# Patient Record
Sex: Male | Born: 1935 | Race: Black or African American | Hispanic: No | Marital: Married | Smoking: Former smoker
Health system: Southern US, Community
[De-identification: ages and names within clinical notes are randomized; demographics above are authoritative.]

## PROBLEM LIST (undated history)

## (undated) DIAGNOSIS — E119 Type 2 diabetes mellitus without complications: Secondary | ICD-10-CM

## (undated) DIAGNOSIS — C801 Malignant (primary) neoplasm, unspecified: Secondary | ICD-10-CM

## (undated) DIAGNOSIS — G629 Polyneuropathy, unspecified: Secondary | ICD-10-CM

## (undated) DIAGNOSIS — I10 Essential (primary) hypertension: Secondary | ICD-10-CM

## (undated) HISTORY — PX: OTHER SURGICAL HISTORY: SHX169

---

## 1999-04-10 ENCOUNTER — Ambulatory Visit (HOSPITAL_COMMUNITY): Admission: RE | Admit: 1999-04-10 | Discharge: 1999-04-10 | Payer: Self-pay | Admitting: Family Medicine

## 2000-01-12 ENCOUNTER — Encounter: Payer: Self-pay | Admitting: Family Medicine

## 2000-01-12 ENCOUNTER — Encounter: Admission: RE | Admit: 2000-01-12 | Discharge: 2000-01-12 | Payer: Self-pay | Admitting: Family Medicine

## 2000-10-14 ENCOUNTER — Ambulatory Visit: Admission: RE | Admit: 2000-10-14 | Discharge: 2000-10-14 | Payer: Self-pay | Admitting: Orthopaedic Surgery

## 2000-10-14 ENCOUNTER — Encounter: Admission: RE | Admit: 2000-10-14 | Discharge: 2000-10-14 | Payer: Self-pay | Admitting: Orthopaedic Surgery

## 2000-10-14 ENCOUNTER — Encounter: Payer: Self-pay | Admitting: Orthopaedic Surgery

## 2000-10-28 ENCOUNTER — Ambulatory Visit: Admission: RE | Admit: 2000-10-28 | Discharge: 2000-10-28 | Payer: Self-pay | Admitting: Pulmonary Disease

## 2000-12-07 ENCOUNTER — Other Ambulatory Visit: Admission: RE | Admit: 2000-12-07 | Discharge: 2000-12-07 | Payer: Self-pay | Admitting: Urology

## 2000-12-20 ENCOUNTER — Ambulatory Visit: Admission: RE | Admit: 2000-12-20 | Discharge: 2001-03-20 | Payer: Self-pay | Admitting: Radiation Oncology

## 2000-12-23 ENCOUNTER — Encounter: Payer: Self-pay | Admitting: Urology

## 2000-12-23 ENCOUNTER — Encounter: Admission: RE | Admit: 2000-12-23 | Discharge: 2000-12-23 | Payer: Self-pay | Admitting: Urology

## 2001-01-17 ENCOUNTER — Ambulatory Visit (HOSPITAL_COMMUNITY): Admission: RE | Admit: 2001-01-17 | Discharge: 2001-01-17 | Payer: Self-pay | Admitting: Urology

## 2001-01-17 ENCOUNTER — Encounter: Payer: Self-pay | Admitting: Urology

## 2001-01-27 ENCOUNTER — Encounter: Payer: Self-pay | Admitting: Pulmonary Disease

## 2001-01-27 ENCOUNTER — Encounter: Admission: RE | Admit: 2001-01-27 | Discharge: 2001-01-27 | Payer: Self-pay | Admitting: Pulmonary Disease

## 2001-03-06 ENCOUNTER — Ambulatory Visit (HOSPITAL_COMMUNITY): Admission: RE | Admit: 2001-03-06 | Discharge: 2001-03-06 | Payer: Self-pay | Admitting: Urology

## 2001-03-06 ENCOUNTER — Encounter: Payer: Self-pay | Admitting: Urology

## 2001-03-27 ENCOUNTER — Ambulatory Visit: Admission: RE | Admit: 2001-03-27 | Discharge: 2001-06-25 | Payer: Self-pay | Admitting: Radiation Oncology

## 2001-08-10 ENCOUNTER — Ambulatory Visit (HOSPITAL_COMMUNITY): Admission: RE | Admit: 2001-08-10 | Discharge: 2001-08-10 | Payer: Self-pay | Admitting: Family Medicine

## 2005-07-28 ENCOUNTER — Encounter: Admission: RE | Admit: 2005-07-28 | Discharge: 2005-07-28 | Payer: Self-pay | Admitting: General Surgery

## 2005-08-02 ENCOUNTER — Ambulatory Visit (HOSPITAL_BASED_OUTPATIENT_CLINIC_OR_DEPARTMENT_OTHER): Admission: RE | Admit: 2005-08-02 | Discharge: 2005-08-02 | Payer: Self-pay | Admitting: General Surgery

## 2005-12-28 ENCOUNTER — Ambulatory Visit: Payer: Self-pay | Admitting: Gastroenterology

## 2006-02-02 ENCOUNTER — Ambulatory Visit: Payer: Self-pay | Admitting: Gastroenterology

## 2006-05-25 ENCOUNTER — Ambulatory Visit: Payer: Self-pay

## 2006-06-01 ENCOUNTER — Ambulatory Visit: Payer: Self-pay | Admitting: Cardiology

## 2006-06-14 ENCOUNTER — Ambulatory Visit: Payer: Self-pay

## 2012-07-11 ENCOUNTER — Other Ambulatory Visit: Payer: Self-pay | Admitting: Internal Medicine

## 2012-07-11 DIAGNOSIS — I739 Peripheral vascular disease, unspecified: Secondary | ICD-10-CM

## 2012-07-13 ENCOUNTER — Ambulatory Visit
Admission: RE | Admit: 2012-07-13 | Discharge: 2012-07-13 | Disposition: A | Discharge status: Home / Self Care | Payer: Medicare Other | Source: Ambulatory Visit | Attending: Internal Medicine | Admitting: Internal Medicine

## 2012-07-13 DIAGNOSIS — I739 Peripheral vascular disease, unspecified: Secondary | ICD-10-CM

## 2014-01-31 ENCOUNTER — Other Ambulatory Visit: Payer: Self-pay | Admitting: Internal Medicine

## 2014-01-31 ENCOUNTER — Ambulatory Visit
Admission: RE | Admit: 2014-01-31 | Discharge: 2014-01-31 | Disposition: A | Discharge status: Home / Self Care | Payer: Medicare Other | Source: Ambulatory Visit | Attending: Internal Medicine | Admitting: Internal Medicine

## 2014-01-31 DIAGNOSIS — M549 Dorsalgia, unspecified: Secondary | ICD-10-CM

## 2014-04-01 ENCOUNTER — Encounter (HOSPITAL_COMMUNITY): Payer: Medicare Other

## 2014-04-09 ENCOUNTER — Encounter (HOSPITAL_COMMUNITY): Payer: Self-pay

## 2015-07-21 ENCOUNTER — Observation Stay (HOSPITAL_COMMUNITY)
Admission: EM | Admit: 2015-07-21 | Discharge: 2015-07-23 | Disposition: A | Discharge status: Skilled Nursing Facility | Payer: Medicare Other | Attending: Internal Medicine | Admitting: Internal Medicine

## 2015-07-21 ENCOUNTER — Emergency Department (HOSPITAL_COMMUNITY): Payer: Medicare Other

## 2015-07-21 ENCOUNTER — Encounter (HOSPITAL_COMMUNITY): Payer: Self-pay | Admitting: *Deleted

## 2015-07-21 DIAGNOSIS — Z7982 Long term (current) use of aspirin: Secondary | ICD-10-CM | POA: Insufficient documentation

## 2015-07-21 DIAGNOSIS — Z8546 Personal history of malignant neoplasm of prostate: Secondary | ICD-10-CM | POA: Diagnosis not present

## 2015-07-21 DIAGNOSIS — Z79891 Long term (current) use of opiate analgesic: Secondary | ICD-10-CM | POA: Insufficient documentation

## 2015-07-21 DIAGNOSIS — Z87891 Personal history of nicotine dependence: Secondary | ICD-10-CM | POA: Insufficient documentation

## 2015-07-21 DIAGNOSIS — T148 Other injury of unspecified body region: Secondary | ICD-10-CM | POA: Diagnosis not present

## 2015-07-21 DIAGNOSIS — J111 Influenza due to unidentified influenza virus with other respiratory manifestations: Principal | ICD-10-CM

## 2015-07-21 DIAGNOSIS — A419 Sepsis, unspecified organism: Secondary | ICD-10-CM | POA: Diagnosis not present

## 2015-07-21 DIAGNOSIS — N179 Acute kidney failure, unspecified: Secondary | ICD-10-CM | POA: Diagnosis not present

## 2015-07-21 DIAGNOSIS — R509 Fever, unspecified: Secondary | ICD-10-CM | POA: Diagnosis present

## 2015-07-21 DIAGNOSIS — E1140 Type 2 diabetes mellitus with diabetic neuropathy, unspecified: Secondary | ICD-10-CM | POA: Diagnosis not present

## 2015-07-21 DIAGNOSIS — E785 Hyperlipidemia, unspecified: Secondary | ICD-10-CM | POA: Insufficient documentation

## 2015-07-21 DIAGNOSIS — I10 Essential (primary) hypertension: Secondary | ICD-10-CM

## 2015-07-21 DIAGNOSIS — E11649 Type 2 diabetes mellitus with hypoglycemia without coma: Secondary | ICD-10-CM | POA: Insufficient documentation

## 2015-07-21 DIAGNOSIS — R109 Unspecified abdominal pain: Secondary | ICD-10-CM

## 2015-07-21 DIAGNOSIS — E876 Hypokalemia: Secondary | ICD-10-CM | POA: Diagnosis not present

## 2015-07-21 DIAGNOSIS — E119 Type 2 diabetes mellitus without complications: Secondary | ICD-10-CM | POA: Diagnosis not present

## 2015-07-21 DIAGNOSIS — Z7984 Long term (current) use of oral hypoglycemic drugs: Secondary | ICD-10-CM | POA: Diagnosis not present

## 2015-07-21 DIAGNOSIS — Z79899 Other long term (current) drug therapy: Secondary | ICD-10-CM | POA: Diagnosis not present

## 2015-07-21 DIAGNOSIS — S098XXA Other specified injuries of head, initial encounter: Secondary | ICD-10-CM | POA: Diagnosis not present

## 2015-07-21 DIAGNOSIS — R531 Weakness: Secondary | ICD-10-CM | POA: Diagnosis not present

## 2015-07-21 HISTORY — DX: Polyneuropathy, unspecified: G62.9

## 2015-07-21 HISTORY — DX: Essential (primary) hypertension: I10

## 2015-07-21 HISTORY — DX: Type 2 diabetes mellitus without complications: E11.9

## 2015-07-21 HISTORY — DX: Malignant (primary) neoplasm, unspecified: C80.1

## 2015-07-21 LAB — COMPREHENSIVE METABOLIC PANEL
ALBUMIN: 3.9 g/dL (ref 3.5–5.0)
ALK PHOS: 35 U/L — AB (ref 38–126)
ALT: 32 U/L (ref 17–63)
ANION GAP: 12 (ref 5–15)
AST: 82 U/L — AB (ref 15–41)
BUN: 18 mg/dL (ref 6–20)
CALCIUM: 8.6 mg/dL — AB (ref 8.9–10.3)
CO2: 23 mmol/L (ref 22–32)
Chloride: 102 mmol/L (ref 101–111)
Creatinine, Ser: 1.58 mg/dL — ABNORMAL HIGH (ref 0.61–1.24)
GFR calc Af Amer: 42 mL/min — ABNORMAL LOW (ref >60)
GFR calc non Af Amer: 37 mL/min — ABNORMAL LOW (ref >60)
GLUCOSE: 89 mg/dL (ref 65–99)
POTASSIUM: 3.1 mmol/L — AB (ref 3.5–5.1)
SODIUM: 137 mmol/L (ref 135–145)
Total Bilirubin: 0.9 mg/dL (ref 0.3–1.2)
Total Protein: 7.5 g/dL (ref 6.5–8.1)

## 2015-07-21 LAB — INFLUENZA PANEL BY PCR (TYPE A & B)
H1N1 flu by pcr: NOT DETECTED
Influenza A By PCR: POSITIVE — AB
Influenza B By PCR: NEGATIVE

## 2015-07-21 LAB — URINALYSIS, ROUTINE W REFLEX MICROSCOPIC
BILIRUBIN URINE: NEGATIVE
GLUCOSE, UA: NEGATIVE mg/dL
Ketones, ur: NEGATIVE mg/dL
Leukocytes, UA: NEGATIVE
Nitrite: NEGATIVE
PH: 5.5 (ref 5.0–8.0)
Protein, ur: >300 mg/dL — AB
SPECIFIC GRAVITY, URINE: 1.021 (ref 1.005–1.030)

## 2015-07-21 LAB — CBC WITH DIFFERENTIAL/PLATELET
BASOS PCT: 0 %
Basophils Absolute: 0 10*3/uL (ref 0.0–0.1)
Eosinophils Absolute: 0 10*3/uL (ref 0.0–0.7)
Eosinophils Relative: 0 %
HEMATOCRIT: 40.6 % (ref 39.0–52.0)
HEMOGLOBIN: 13.4 g/dL (ref 13.0–17.0)
LYMPHS PCT: 12 %
Lymphs Abs: 0.7 10*3/uL (ref 0.7–4.0)
MCH: 28.5 pg (ref 26.0–34.0)
MCHC: 33 g/dL (ref 30.0–36.0)
MCV: 86.2 fL (ref 78.0–100.0)
MONOS PCT: 7 %
Monocytes Absolute: 0.4 10*3/uL (ref 0.1–1.0)
NEUTROS ABS: 4.4 10*3/uL (ref 1.7–7.7)
Neutrophils Relative %: 81 %
Platelets: UNDETERMINED 10*3/uL (ref 150–400)
RBC: 4.71 MIL/uL (ref 4.22–5.81)
RDW: 14.9 % (ref 11.5–15.5)
WBC: 5.5 10*3/uL (ref 4.0–10.5)

## 2015-07-21 LAB — GLUCOSE, CAPILLARY: Glucose-Capillary: 93 mg/dL (ref 65–99)

## 2015-07-21 LAB — URINE MICROSCOPIC-ADD ON

## 2015-07-21 LAB — APTT: APTT: 35 s (ref 24–37)

## 2015-07-21 LAB — LACTIC ACID, PLASMA
Lactic Acid, Venous: 1.5 mmol/L (ref 0.5–2.0)
Lactic Acid, Venous: 1.4 mmol/L (ref 0.5–2.0)

## 2015-07-21 LAB — PSA: PSA: <0.01 ng/mL (ref 0.00–4.00)

## 2015-07-21 LAB — I-STAT CG4 LACTIC ACID, ED: Lactic Acid, Venous: 1.8 mmol/L (ref 0.5–2.0)

## 2015-07-21 LAB — PROTIME-INR
INR: 1.06 (ref 0.00–1.49)
Prothrombin Time: 14 seconds (ref 11.6–15.2)

## 2015-07-21 LAB — PROCALCITONIN: PROCALCITONIN: 1.27 ng/mL

## 2015-07-21 LAB — CBC AND DIFFERENTIAL: WBC: 5.5 10^3/mL

## 2015-07-21 MED ORDER — OXYBUTYNIN CHLORIDE 5 MG PO TABS
5.0000 mg | ORAL_TABLET | Freq: Every day | ORAL | Status: DC
  Administered 2015-07-21 – 2015-07-23 (×3): 5 mg via ORAL
  Filled 2015-07-21 (×3): qty 1

## 2015-07-21 MED ORDER — ATORVASTATIN CALCIUM 10 MG PO TABS
10.0000 mg | ORAL_TABLET | Freq: Every day | ORAL | Status: DC
  Administered 2015-07-22 – 2015-07-23 (×2): 10 mg via ORAL
  Filled 2015-07-21 (×3): qty 1

## 2015-07-21 MED ORDER — OSELTAMIVIR PHOSPHATE 30 MG PO CAPS
30.0000 mg | ORAL_CAPSULE | Freq: Two times a day (BID) | ORAL | Status: DC
  Administered 2015-07-21 – 2015-07-23 (×4): 30 mg via ORAL
  Filled 2015-07-21 (×5): qty 1

## 2015-07-21 MED ORDER — GABAPENTIN 300 MG PO CAPS
300.0000 mg | ORAL_CAPSULE | Freq: Three times a day (TID) | ORAL | Status: DC
  Administered 2015-07-22 – 2015-07-23 (×5): 300 mg via ORAL
  Filled 2015-07-21 (×7): qty 1

## 2015-07-21 MED ORDER — ONDANSETRON HCL 4 MG/2ML IJ SOLN: 4.0000 mg | Freq: Four times a day (QID) | INTRAMUSCULAR | Status: DC | PRN

## 2015-07-21 MED ORDER — ALBUTEROL SULFATE (2.5 MG/3ML) 0.083% IN NEBU: 2.5000 mg | INHALATION_SOLUTION | RESPIRATORY_TRACT | Status: DC | PRN

## 2015-07-21 MED ORDER — VANCOMYCIN HCL IN DEXTROSE 1-5 GM/200ML-% IV SOLN
1000.0000 mg | Freq: Once | INTRAVENOUS | Status: AC
  Administered 2015-07-21: 1000 mg via INTRAVENOUS
  Filled 2015-07-21: qty 200

## 2015-07-21 MED ORDER — SODIUM CHLORIDE 0.9 % IV BOLUS (SEPSIS)
500.0000 mL | INTRAVENOUS | Status: AC
  Administered 2015-07-21: 500 mL via INTRAVENOUS

## 2015-07-21 MED ORDER — TAMSULOSIN HCL 0.4 MG PO CAPS
0.4000 mg | ORAL_CAPSULE | Freq: Every day | ORAL | Status: DC
  Administered 2015-07-21 – 2015-07-23 (×3): 0.4 mg via ORAL
  Filled 2015-07-21 (×3): qty 1

## 2015-07-21 MED ORDER — SODIUM CHLORIDE 0.9 % IV SOLN
INTRAVENOUS | Status: DC
  Administered 2015-07-21 – 2015-07-22 (×2): via INTRAVENOUS

## 2015-07-21 MED ORDER — VANCOMYCIN HCL IN DEXTROSE 1-5 GM/200ML-% IV SOLN
1000.0000 mg | INTRAVENOUS | Status: DC
  Administered 2015-07-22: 1000 mg via INTRAVENOUS
  Filled 2015-07-21: qty 200

## 2015-07-21 MED ORDER — HYDROCODONE-ACETAMINOPHEN 5-325 MG PO TABS
1.0000 | ORAL_TABLET | Freq: Three times a day (TID) | ORAL | Status: DC | PRN
  Administered 2015-07-21 – 2015-07-22 (×2): 1 via ORAL
  Filled 2015-07-21 (×2): qty 1

## 2015-07-21 MED ORDER — SODIUM CHLORIDE 0.9 % IV BOLUS (SEPSIS)
1000.0000 mL | INTRAVENOUS | Status: AC
  Administered 2015-07-21 (×2): 1000 mL via INTRAVENOUS

## 2015-07-21 MED ORDER — POTASSIUM CHLORIDE CRYS ER 20 MEQ PO TBCR
40.0000 meq | EXTENDED_RELEASE_TABLET | Freq: Once | ORAL | Status: AC
  Administered 2015-07-21: 40 meq via ORAL
  Filled 2015-07-21: qty 2

## 2015-07-21 MED ORDER — ROPINIROLE HCL 0.25 MG PO TABS
0.2500 mg | ORAL_TABLET | Freq: Every day | ORAL | Status: DC
  Administered 2015-07-21 – 2015-07-22 (×2): 0.25 mg via ORAL
  Filled 2015-07-21 (×3): qty 1

## 2015-07-21 MED ORDER — CLONIDINE HCL 0.2 MG PO TABS
0.2000 mg | ORAL_TABLET | Freq: Two times a day (BID) | ORAL | Status: DC
  Administered 2015-07-21 – 2015-07-23 (×4): 0.2 mg via ORAL
  Filled 2015-07-21 (×5): qty 1

## 2015-07-21 MED ORDER — ACETAMINOPHEN 650 MG RE SUPP
650.0000 mg | Freq: Once | RECTAL | Status: AC
  Administered 2015-07-21: 650 mg via RECTAL
  Filled 2015-07-21: qty 1

## 2015-07-21 MED ORDER — ACETAMINOPHEN 325 MG PO TABS: 650.0000 mg | ORAL_TABLET | Freq: Four times a day (QID) | ORAL | Status: DC | PRN

## 2015-07-21 MED ORDER — INSULIN ASPART 100 UNIT/ML ~~LOC~~ SOLN
0.0000 [IU] | Freq: Three times a day (TID) | SUBCUTANEOUS | Status: DC
  Administered 2015-07-22 – 2015-07-23 (×2): 2 [IU] via SUBCUTANEOUS
  Administered 2015-07-23: 1 [IU] via SUBCUTANEOUS

## 2015-07-21 MED ORDER — ASPIRIN EC 81 MG PO TBEC
81.0000 mg | DELAYED_RELEASE_TABLET | Freq: Every day | ORAL | Status: DC
  Administered 2015-07-21 – 2015-07-23 (×3): 81 mg via ORAL
  Filled 2015-07-21 (×3): qty 1

## 2015-07-21 MED ORDER — ONDANSETRON HCL 4 MG PO TABS: 4.0000 mg | ORAL_TABLET | Freq: Four times a day (QID) | ORAL | Status: DC | PRN

## 2015-07-21 MED ORDER — PIPERACILLIN-TAZOBACTAM 3.375 G IVPB
3.3750 g | Freq: Three times a day (TID) | INTRAVENOUS | Status: DC
Start: 2015-07-21 — End: 2015-07-22
  Administered 2015-07-21 – 2015-07-22 (×3): 3.375 g via INTRAVENOUS
  Filled 2015-07-21 (×4): qty 50

## 2015-07-21 MED ORDER — SODIUM CHLORIDE 0.9 % IV SOLN
INTRAVENOUS | Status: AC
Start: 2015-07-21 — End: 2015-07-22
  Administered 2015-07-21: 19:00:00 via INTRAVENOUS

## 2015-07-21 MED ORDER — PIPERACILLIN-TAZOBACTAM 3.375 G IVPB 30 MIN
3.3750 g | Freq: Once | INTRAVENOUS | Status: AC
  Administered 2015-07-21: 3.375 g via INTRAVENOUS
  Filled 2015-07-21: qty 50

## 2015-07-21 MED ORDER — HEPARIN SODIUM (PORCINE) 5000 UNIT/ML IJ SOLN
5000.0000 [IU] | Freq: Three times a day (TID) | INTRAMUSCULAR | Status: DC
  Administered 2015-07-21 – 2015-07-23 (×6): 5000 [IU] via SUBCUTANEOUS
  Filled 2015-07-21 (×8): qty 1

## 2015-07-21 NOTE — H&P (Signed)
Patient Demographics  Mark Valenzuela, is a 82 y.o. male  MRN: [id number]   DOB - 1923/03/31  Admit Date - 07/21/2015  Outpatient Primary MD for the patient is [name], MD   With History of -  Past Medical History  Diagnosis Date  . Diabetes mellitus without complication ([place])   . Hypertension   . Cancer (HCC) approx. 2002    prostate cancer  . Neuropathy [facility])       Past Surgical History  Procedure Laterality Date  . Seed implants  approx 2002    for prostate cancer    in for   Chief Complaint  Patient presents with  . Code Sepsis     HPI  Mark Valenzuela  is a 82 y.o. male, past medical history of diabetes mellitus, hypertension, prostate cancer, diabetic neuropathy, is at home with his wife, presents with complaints of generalized weakness, fatigue, poor appetite, fever and chills, and diarrhea, reports his symptoms started Saturday, he had his sister visiting who was feeling ill, will family reports episodes of hypoglycemia as he was taking his diabetes medication but with poor oral intake, NED significant for fever 104.1, but no leukocytosis, chest x-ray with no acute cardiopulmonary process, urinalysis, patient has blood culture sent, started on IV vancomycin and Zosyn for sepsis, hospitalist called to admit.    Review of Systems    In addition to the HPI above,  reports Fever-chills, No Headache, No changes with Vision or hearing, No problems swallowing food or Liquids, No Chest pain,  planes of Cou[place] denies Shortness of Breath, No Abdominal pain, No Nausea or Vomports diarrhea over last 2 days No Blood in stool or Urine, No dysuria, No new skin rashes or bruises,  report generalized body ache report generalized weakness and fatigue, but no focal deficits No recent weight gain or loss, No polyuria, polydypsia or polyphagia, No significant Mental Stressors.  A full 10 point Review of Systems was done, except as stated above, all other Review of  Systems were negative.   Social History Social History  Substance Use Topics  . Smoking status: Former Smoker -- 0.50 packs/day for 50 years    Types: Cigarettes    Quit date: 06/06/2015  . Smokeless tobacco: Never Used  . Alcohol Use: No     Comment: 07/21/15 -quit approx 31 years today     Family History  family history significant for hypertension   Prior to Admission medications   Medication Sig Start Date End Date Taking? Authorizing Provider  acetaminophen (TYLENOL) 500 MG tablet Take 500 mg by mouth every 6 (six) hours as needed for mild pain.   Yes Historical Provider, MD  aspirin EC 81 MG tablet Take 81 mg by mouth daily.   Yes Historical Provider, MD  atorvastatin (LIPITOR) 10 MG tablet Take 10 mg by mouth daily.   Yes Historical Provider, MD  cloNIDine (CATAPRES) 0.2 MG tablet Take 0.2 mg by mouth 2 (two) times daily.   Yes Historical Provider, MD  gabapentin (NEURONTIN) 300 MG capsule Take 300 mg by mouth 3 (three) times daily.   Yes Historical Provider, MD  glimepiride (AMARYL) 4 MG tablet Take 1 tablet by mouth daily. 07/10/15   Historical Provider, MD  glyBURIDE (DIABETA) 5 MG tablet Take 5 mg by mouth daily with breakfast.   Yes Historical Provider, MD  HYDROcodone-acetaminophen (NORCO/VICODIN) 5-325 MG tablet Take 1 tablet by mouth every 8 (eight) hours as needed for moderate pain.   Yes Historical  Provider, MD  oxybutynin (DITROPAN) 5 MG tablet Take 5 mg by mouth daily.   Yes Historical Provider, MD  rOPINIRole (REQUIP) 0.25 MG tablet Take 0.25 mg by mouth at bedtime.   Yes Historical Provider, MD  tamsulosin (FLOMAX) 0.4 MG CAPS capsule Take 0.4 mg by mouth daily.   Yes Historical Provider, MD    No Known Allergies  Physical Exam  Vitals  Blood pressure 135/76, pulse 81, temperature 104.1 F (40.1 C), temperature source Rectal, resp. rate 34, height 5\' 8"  (1.727 m), weight 77.111 kg (170 lb), SpO2 96 %.   1. General frail elderly male lying in bed in NAD,      2. Normal affect and insight, Not Suicidal or Homicidal, Awake Alert, Oriented X 2.  3. No F.N deficits, ALL C.Nerves Intact, Strength 5/5 all 4 extremities, Sensation intact all 4 extremities, Plantars down going.  4. Ears and Eyes appear Normal, Conjunctivae clear, PERRLA. dry Oral Mucosa.  5. Supple Neck, No JVD, No cervical lymphadenopathy appriciated, No Carotid Bruits.  6. Symmetrical Chest wall movement, Good air movement bilaterally, CTAB.  7. RRR, No Gallops, Rubs or Murmurs, No Parasternal Heave.  8. Positive Bowel Sounds, Abdomen Soft, No tenderness, No organomegaly appriciated,No rebound -guarding or rigidity.  9.  No Cyanosis, Normal Skin Turgor, No Skin Rash or Bruise.  10. Good muscle tone,  joints appear normal , no effusions, Normal ROM.    Data Review  CBC  Recent Labs Lab 07/21/15 1431  WBC 5.5  HGB 13.4  HCT 40.6  PLT PLATELET CLUMPS NOTED ON SMEAR, UNABLE TO ESTIMATE  MCV 86.2  MCH 28.5  MCHC 33.0  RDW 14.9  LYMPHSABS 0.7  MONOABS 0.4  EOSABS 0.0  BASOSABS 0.0   ------------------------------------------------------------------------------------------------------------------  Chemistries   Recent Labs Lab 07/21/15 1431  NA 137  K 3.1*  CL 102  CO2 23  GLUCOSE 89  BUN 18  CREATININE 1.58*  CALCIUM 8.6*  AST 82*  ALT 32  ALKPHOS 35*  BILITOT 0.9   ------------------------------------------------------------------------------------------------------------------ estimated creatinine clearance is 29.5 mL/min (by C-G formula based on Cr of 1.58). ------------------------------------------------------------------------------------------------------------------ No results for input(s): TSH, T4TOTAL, T3FREE, THYROIDAB in the last 72 hours.  Invalid input(s): FREET3   Coagulation profile No results for input(s): INR, PROTIME in the last 168  hours. ------------------------------------------------------------------------------------------------------------------- No results for input(s): DDIMER in the last 72 hours. -------------------------------------------------------------------------------------------------------------------  Cardiac Enzymes No results for input(s): CKMB, TROPONINI, MYOGLOBIN in the last 168 hours.  Invalid input(s): CK ------------------------------------------------------------------------------------------------------------------ Invalid input(s): POCBNP   ---------------------------------------------------------------------------------------------------------------  Urinalysis    Component Value Date/Time   COLORURINE YELLOW 07/21/2015 1514   APPEARANCEUR CLOUDY* 07/21/2015 1514   LABSPEC 1.021 07/21/2015 1514   PHURINE 5.5 07/21/2015 1514   GLUCOSEU NEGATIVE 07/21/2015 1514   HGBUR LARGE* 07/21/2015 [place] 07/21/2015 1514   KETONESUR NEGATIVE 07/21/2015 1514   PROTEINUR >300* 07/21/2015 1514   NITRITE NEGATIVE 07/21/2015 1514   LEUKOCYTESUR NEGATIVE 07/21/2015 1514    ----------------------------------------------------------------------------------------------------------------  Imaging results:   Dg Chest 2 View  07/21/2015  CLINICAL DATA:  Generalized weakness with diarrhea and shortness of breath. EXAM: CHEST  2 VIEW COMPARISON:  Chest radiographs 01/17/2001 and 07/28/2005. Lumbar spine radiographs 01/31/2014. FINDINGS: There are lower lung volumes. Allowing for this, the heart size and mediastinal contours are stable with mild vascular tortuosity. There is diffuse central airway thickening without focal airspace disease or edema. The previously demonstrated right lung nodule is not well visualized. There is no pleural effusion  or pneumothorax. Mild generalized osteosclerosis is suggested (not definite). IMPRESSION: 1. Low lung volumes with generalized central airway  thickening. No focal airspace disease or edema. 2. Suggested mild generalized osteosclerosis. In this patient with apparent prostate cancer, this could reflect metastatic disease. Correlation with serum PSA recommended. Electronically Signed   By: Richardean Sale M.D.   On: 07/21/2015 15:47       Assessment & Plan  Active Problems:   Sepsis ([place])   Fever   Hypertension   Diabetes mellitus type 2 in nonobese (HCC)   Acute renal failure (ARF) (HCC)  Fever -   patient presents with fever, not under sepsis order set, and cultures were sent, has negative urinalysis, chest x-ray with no evidence of infection, no nuchal rigidity and no evidence of meningeal sign, she reports sick contacts, and complains of diarrhea, symptoms most likely related to viral illness. - continue with IV fluids - Follow on septic workup - start Empirically on Tamiflu pending influenza panel and especially giving his significant high fever -  will follow on GI panel given his diarrhea   Acute renal failure -Likely related to volume depletion from poor oral intake, will start on IV fluids  Hypertension  -Blood pressure acceptable, continue with home medication    diabetes mellitus  - We'll hold oral hypoglycemic agent as by mouth is unpredictable, will start on insulin sliding scale, will keep in dysphagia 2 diet giving poor dentition .  Diabetic neuropathy  - Opinion with his Neurontin   Hyperlipidemia  -Continue with statin   DVT Prophylaxis Heparin   AM Labs Ordered, also please review Full Orders  Family Communication: Admission, patients condition and plan of care including tests being ordered have been discussed with the patient and wife and daughter who indicate understanding and agree with the plan and Code Status.  Code Status Full  Likely DC to  Pending PT evaluation  Condition GUARDED    Time spent in minutes : 65 miutes    Jovi Alvizo M.D on 07/21/2015 at 5:46 PM  Between 7am to  7pm - Pager - [phone]  After 7pm go to [email or web address] - password [email or web address]  And look for the night coverage person covering me after hours  [facility] Office  [phone]

## 2015-07-21 NOTE — Progress Notes (Signed)
Pharmacy Antibiotic Note  [name] is a 82 y.o. male admitted on 07/21/2015 with sepsis and fall with injury to back right occiptal area with small hematoma.  Noted fever 104.1.  Pharmacy has been consulted for Vancomycin and Zosyn dosing.  First doses ordered in ED. No allergies noted.    -CrCl ~30 ml/min  Plan: Vancomycin 1g IV every 24 hours.  Goal trough 15-20 mcg/mL.  Zosyn 3.375g IV q8h (infuse over 4 hours) F/u renal fxn closely to adjust F/u cultures   Height: 5\' 8"  (172.7 cm) Weight: 170 lb (77.111 kg) IBW/kg (Calculated) : 68.4  Temp (24hrs), Avg:102.5 F (39.2 C), Min:100.9 F (38.3 C), Max:104.1 F (40.1 C)   Recent Labs Lab 07/21/15 1440  LATICACIDVEN 1.80    CrCl cannot be calculated (Patient has no serum creatinine result on file.).    No Known Allergies  Antimicrobials this admission: 3/6 Vancomycin >>  3/6 Zosyn >>   Dose adjustments this admission: N/A  Microbiology results: 3/6 BCx: IP 3/6 UCx: IP (U/A: Many bacteria) 3/6 GI PCR Panel: Ordered  Thank you for allowing pharmacy to be a part of this patient's care.  Ralene Bathe, PharmD, BCPS 07/21/2015, 3:58 PM  Pager: [phone]

## 2015-07-21 NOTE — ED Notes (Addendum)
Per EMS pt from home been sick for 3 days. Family reports diarrhea and generalized weakness. Pt is having trouble standing today. Today at home with EMS pt had 103.3 temp with home thermometer. 1000 mg Tylenol PO given in route. BP 150/90, HR 96, 95% on room air, Resp 28. CBG 111. EMS also reports pt had fall today from a standing position and hit the back right Occipital area with small hematoma. NO LOC. Pt is not c/o of pain to neck, head or back.

## 2015-07-21 NOTE — Progress Notes (Signed)
EDCM spoke to patient's daughter [name] [phone].  Patient lives at home with his wife.  Patient's wife has some balance issues herself per daughter.  Patient has a walker, cane and wheelchair at home.  Patient reports patient is usually able to perform his ADL's but recently it has become very difficult for him.  Patient's pcp is Dr. Seward Carol per patient's daughter.  Patient's daughter reports the plan is for the patient to return to home.  EDCM provided patient's daughter with list of home health agencies in [facility], explained services.  Patient's daughter reports the patient has had [facility] in the past and would like them again for services.  Patient's daughter also reports the patient is a veteran and would like to know the process for establishing care at the [place].  EDCM provided patient's daughter with phone number to [facility] clinic to speak to someone regarding that process.  EDCM also provided patient's daughter with list of private duty nursing agencies and explained it would be an out of pocket expense.  Patient's daughter thankful for services.  No further EDCm needs at this time.

## 2015-07-21 NOTE — ED Provider Notes (Signed)
CSN: [id number]     Arrival date & time 07/21/15  1345 History   First MD Initiated Contact with Patient 07/21/15 1426     Chief Complaint  Patient presents with  . Code Sepsis     (Consider location/radiation/quality/duration/timing/severity/associated sxs/prior Treatment) HPI Comments: Pt comes in with cc of weakness and fevers. Pt has hx of DM, BPH. He reports getting sick the last 3-4 days. Pt is having a cough, mostly clear. He otherwise just has generalized weakness. Pt denies nausea, emesis, chest pains, shortness of breath, uri like symptoms, headaches, neck pain, abdominal pain, uti like symptoms. ROS is + for loose BM x 2/3 the last couple of days, weakness. No recent admission. No flu shots.   ROS 10 Systems reviewed and are negative for acute change except as noted in the HPI.      The history is provided by the patient.    No past medical history on file. No past surgical history on file. No family history on file. Social History  Substance Use Topics  . Smoking status: Not on file  . Smokeless tobacco: Not on file  . Alcohol Use: Not on file    Review of Systems    Allergies  Review of patient's allergies indicates no known allergies.  Home Medications   Prior to Admission medications   Medication Sig Start Date End Date Taking? Authorizing Provider  acetaminophen (TYLENOL) 500 MG tablet Take 500 mg by mouth every 6 (six) hours as needed for mild pain.   Yes Historical Provider, MD  aspirin EC 81 MG tablet Take 81 mg by mouth daily.   Yes Historical Provider, MD  atorvastatin (LIPITOR) 10 MG tablet Take 10 mg by mouth daily.   Yes Historical Provider, MD  cloNIDine (CATAPRES) 0.2 MG tablet Take 0.2 mg by mouth 2 (two) times daily.   Yes Historical Provider, MD  gabapentin (NEURONTIN) 300 MG capsule Take 300 mg by mouth 3 (three) times daily.   Yes Historical Provider, MD  glimepiride (AMARYL) 4 MG tablet Take 1 tablet by mouth daily. 07/10/15   Historical  Provider, MD  glyBURIDE (DIABETA) 5 MG tablet Take 5 mg by mouth daily with breakfast.   Yes Historical Provider, MD  HYDROcodone-acetaminophen (NORCO/VICODIN) 5-325 MG tablet Take 1 tablet by mouth every 8 (eight) hours as needed for moderate pain.   Yes Historical Provider, MD  oxybutynin (DITROPAN) 5 MG tablet Take 5 mg by mouth daily.   Yes Historical Provider, MD  rOPINIRole (REQUIP) 0.25 MG tablet Take 0.25 mg by mouth at bedtime.   Yes Historical Provider, MD  tamsulosin (FLOMAX) 0.4 MG CAPS capsule Take 0.4 mg by mouth daily.   Yes Historical Provider, MD   BP 124/69 mmHg  Pulse 93  Temp(Src) 104.1 F (40.1 C) (Rectal)  Resp 22  Ht 5\' 8"  (1.727 m)  Wt 170 lb (77.111 kg)  BMI 25.85 kg/m2  SpO2 92% Physical Exam  Constitutional: He is oriented to person, place, and time. He appears well-developed.  HENT:  Head: Normocephalic and atraumatic.  Mouth/Throat: Oropharynx is clear and moist. No oropharyngeal exudate.  Eyes: Conjunctivae and EOM are normal. Pupils are equal, round, and reactive to light.  Neck: Normal range of motion. Neck supple.  Cardiovascular: Normal rate and regular rhythm.   Murmur heard. Pulmonary/Chest: Effort normal and breath sounds normal. No respiratory distress. He has no wheezes.  Lower lung field rhonchi  Abdominal: Soft. Bowel sounds are normal. He exhibits no distension. There is  no tenderness. There is no rebound and no guarding.  Musculoskeletal: He exhibits no edema or tenderness.  Lymphadenopathy:    He has no cervical adenopathy.  Neurological: He is alert and oriented to person, place, and time.  Skin: Skin is warm.  Nursing note and vitals reviewed.   ED Course  Procedures (including critical care time) Labs Review Labs Reviewed  COMPREHENSIVE METABOLIC PANEL - Abnormal; Notable for the following:    Potassium 3.1 (*)    Creatinine, Ser 1.58 (*)    Calcium 8.6 (*)    AST 82 (*)    Alkaline Phosphatase 35 (*)    GFR calc non Af Amer  37 (*)    GFR calc Af Amer 42 (*)    All other components within normal limits  URINALYSIS, ROUTINE W REFLEX MICROSCOPIC (NOT AT [facility]) - Abnormal; Notable for the following:    APPearance CLOUDY (*)    Hgb urine dipstick LARGE (*)    Protein, ur >300 (*)    All other components within normal limits  URINE MICROSCOPIC-ADD ON - Abnormal; Notable for the following:    Squamous Epithelial / LPF 0-5 (*)    Bacteria, UA MANY (*)    Casts GRANULAR CAST (*)    All other components within normal limits  CULTURE, BLOOD (ROUTINE X 2)  CULTURE, BLOOD (ROUTINE X 2)  URINE CULTURE  GASTROINTESTINAL PANEL BY PCR, STOOL (REPLACES STOOL CULTURE)  CBC WITH DIFFERENTIAL/PLATELET  I-STAT CG4 LACTIC ACID, ED    Imaging Review Dg Chest 2 View  07/21/2015  CLINICAL DATA:  Generalized weakness with diarrhea and shortness of breath. EXAM: CHEST  2 VIEW COMPARISON:  Chest radiographs 01/17/2001 and 07/28/2005. Lumbar spine radiographs 01/31/2014. FINDINGS: There are lower lung volumes. Allowing for this, the heart size and mediastinal contours are stable with mild vascular tortuosity. There is diffuse central airway thickening without focal airspace disease or edema. The previously demonstrated right lung nodule is not well visualized. There is no pleural effusion or pneumothorax. Mild generalized osteosclerosis is suggested (not definite). IMPRESSION: 1. Low lung volumes with generalized central airway thickening. No focal airspace disease or edema. 2. Suggested mild generalized osteosclerosis. In this patient with apparent prostate cancer, this could reflect metastatic disease. Correlation with serum PSA recommended. Electronically Signed   By: Richardean Sale M.D.   On: 07/21/2015 15:47   I have personally reviewed and evaluated these images and lab results as part of my medical decision-making.   EKG Interpretation None      MDM   Final diagnoses:  Sepsis, due to unspecified organism ([place])    Pt comes  in with fever, tachycardia. Concerns for sepsis - likely source would be urine, lungs. Bacteremia also possible.  Will start code sepsis protocol - fluids, cultures, antibiotics started. Lactate is 1.8. Will admit for further workup.   Varney Biles, MD 07/21/15 1615

## 2015-07-22 ENCOUNTER — Observation Stay (HOSPITAL_COMMUNITY): Payer: Medicare Other

## 2015-07-22 DIAGNOSIS — R509 Fever, unspecified: Secondary | ICD-10-CM | POA: Diagnosis not present

## 2015-07-22 DIAGNOSIS — I10 Essential (primary) hypertension: Secondary | ICD-10-CM | POA: Diagnosis not present

## 2015-07-22 DIAGNOSIS — E119 Type 2 diabetes mellitus without complications: Secondary | ICD-10-CM | POA: Diagnosis not present

## 2015-07-22 DIAGNOSIS — N179 Acute kidney failure, unspecified: Secondary | ICD-10-CM | POA: Diagnosis not present

## 2015-07-22 DIAGNOSIS — R109 Unspecified abdominal pain: Secondary | ICD-10-CM | POA: Diagnosis not present

## 2015-07-22 DIAGNOSIS — J1189 Influenza due to unidentified influenza virus with other manifestations: Secondary | ICD-10-CM

## 2015-07-22 LAB — COMPREHENSIVE METABOLIC PANEL
ALT: 27 U/L (ref 17–63)
AST: 80 U/L — AB (ref 15–41)
Albumin: 3.1 g/dL — ABNORMAL LOW (ref 3.5–5.0)
Alkaline Phosphatase: 29 U/L — ABNORMAL LOW (ref 38–126)
Anion gap: 7 (ref 5–15)
BUN: 15 mg/dL (ref 6–20)
CHLORIDE: 113 mmol/L — AB (ref 101–111)
CO2: 22 mmol/L (ref 22–32)
CREATININE: 1.23 mg/dL (ref 0.61–1.24)
Calcium: 7.9 mg/dL — ABNORMAL LOW (ref 8.9–10.3)
GFR calc non Af Amer: 49 mL/min — ABNORMAL LOW (ref >60)
GFR, EST AFRICAN AMERICAN: 57 mL/min — AB (ref >60)
Glucose, Bld: 87 mg/dL (ref 65–99)
Potassium: 3.8 mmol/L (ref 3.5–5.1)
SODIUM: 142 mmol/L (ref 135–145)
Total Bilirubin: 1 mg/dL (ref 0.3–1.2)
Total Protein: 6.5 g/dL (ref 6.5–8.1)

## 2015-07-22 LAB — GASTROINTESTINAL PANEL BY PCR, STOOL (REPLACES STOOL CULTURE)

## 2015-07-22 LAB — BASIC METABOLIC PANEL
BUN: 15 mg/dL (ref 4–21)
CREATININE: 1.2 mg/dL (ref 0.6–1.3)
Glucose: 87 mg/dL
Sodium: 142 mmol/L (ref 137–147)

## 2015-07-22 LAB — GLUCOSE, CAPILLARY
GLUCOSE-CAPILLARY: 76 mg/dL (ref 65–99)
GLUCOSE-CAPILLARY: 159 mg/dL — AB (ref 65–99)
GLUCOSE-CAPILLARY: 135 mg/dL — AB (ref 65–99)
Glucose-Capillary: 62 mg/dL — ABNORMAL LOW (ref 65–99)
Glucose-Capillary: 107 mg/dL — ABNORMAL HIGH (ref 65–99)

## 2015-07-22 LAB — URINE CULTURE: CULTURE: NO GROWTH

## 2015-07-22 LAB — PHOSPHORUS: PHOSPHORUS: 2.7 mg/dL (ref 2.5–4.6)

## 2015-07-22 LAB — C DIFFICILE QUICK SCREEN W PCR REFLEX
C DIFFICILE (CDIFF) TOXIN: NEGATIVE
C Diff antigen: NEGATIVE
C Diff interpretation: NEGATIVE

## 2015-07-22 LAB — MAGNESIUM: Magnesium: 1.9 mg/dL (ref 1.7–2.4)

## 2015-07-22 MED ORDER — SIMETHICONE 40 MG/0.6ML PO SUSP: 40.0000 mg | Freq: Four times a day (QID) | ORAL | Status: DC | PRN

## 2015-07-22 MED ORDER — SIMETHICONE 40 MG/0.6ML PO SUSP
80.0000 mg | Freq: Four times a day (QID) | ORAL | Status: DC | PRN
  Administered 2015-07-22 – 2015-07-23 (×3): 80 mg via ORAL
  Filled 2015-07-22 (×5): qty 1.2

## 2015-07-22 NOTE — Progress Notes (Signed)
Hypoglycemic Event  CBG: 62  Treatment: 15 GM carbohydrate snack  Symptoms: None  Follow-up CBG: Time:1222 CBG Result:107  Possible Reasons for Event: Inadequate meal intake  Comments/MD notified:hypoglycemic protocol    Mark Valenzuela

## 2015-07-22 NOTE — NC FL2 (Deleted)
[place] LEVEL OF CARE SCREENING TOOL     IDENTIFICATION  Patient Name: ICHAEL MCCARTER Birthdate: October 15, 1922 Sex: male Admission Date (Current Location): 07/21/2015  [facility] and [place] Number:  Herbalist and Address:  [facility],  [place] [address], [place]      Provider Number: [id number]  Attending Physician Name and Address:  Albertine Patricia, MD  Relative Name and Phone Number:       Current Level of Care: Hospital Recommended Level of Care: [place] Prior Approval Number:    Date Approved/Denied:   PASRR Number: [id number]  Discharge Plan: SNF    Current Diagnoses: Patient Active Problem List   Diagnosis Date Noted  . Sepsis ([place]) 07/21/2015  . Fever 07/21/2015  . Hypertension 07/21/2015  . Diabetes mellitus type 2 in nonobese ([place]) 07/21/2015  . Acute renal failure (ARF) ([place]) 07/21/2015    Orientation RESPIRATION BLADDER Height & Weight     Self, Time, Situation, Place  Normal Incontinent, External catheter (Condom Catheter) Weight: 172 lb 9.9 oz (78.3 kg) Height:  5\' 8"  (172.7 cm)  BEHAVIORAL SYMPTOMS/MOOD NEUROLOGICAL BOWEL NUTRITION STATUS  Other (Comment) (None)  (N/a) Continent Diet (Heart Room service appropriate; Fluid consistency: Thin )  AMBULATORY STATUS COMMUNICATION OF NEEDS Skin   Limited Assist Verbally Normal                       Personal Care Assistance Level of Assistance  Bathing, Feeding, Dressing Bathing Assistance: Limited assistance Feeding assistance: Independent Dressing Assistance: Limited assistance     Functional Limitations Info  Sight, Hearing, Speech Sight Info: Adequate Hearing Info: Adequate Speech Info: Adequate    SPECIAL CARE FACTORS FREQUENCY  PT (By licensed PT), OT (By licensed OT)     PT Frequency: 5x week OT Frequency: 5x week            Contractures Contractures Info: Not present    Additional Factors Info  Code Status,  Allergies Code Status Info: FULL Allergies Info: No Known Allergies           Current Medications (07/22/2015):  This is the current hospital active medication list Current Facility-Administered Medications  Medication Dose Route Frequency Provider Last Rate Last Dose  . 0.9 %  sodium chloride infusion   Intravenous Continuous Albertine Patricia, MD 50 mL/hr at 07/22/15 1525    . acetaminophen (TYLENOL) tablet 650 mg  650 mg Oral Q6H PRN Silver Huguenin Elgergawy, MD      . albuterol (PROVENTIL) (2.5 MG/3ML) 0.083% nebulizer solution 2.5 mg  2.5 mg Nebulization Q2H PRN Albertine Patricia, MD      . aspirin EC tablet 81 mg  81 mg Oral Daily Albertine Patricia, MD   81 mg at 07/22/15 1154  . atorvastatin (LIPITOR) tablet 10 mg  10 mg Oral Daily Albertine Patricia, MD   10 mg at 07/22/15 1153  . cloNIDine (CATAPRES) tablet 0.2 mg  0.2 mg Oral BID Albertine Patricia, MD   0.2 mg at 07/22/15 1153  . gabapentin (NEURONTIN) capsule 300 mg  300 mg Oral TID Albertine Patricia, MD   300 mg at 07/22/15 1526  . heparin injection 5,000 Units  5,000 Units Subcutaneous 3 times per day Albertine Patricia, MD   5,000 Units at 07/22/15 1423  . HYDROcodone-acetaminophen (NORCO/VICODIN) 5-325 MG per tablet 1 tablet  1 tablet Oral Q8H PRN Albertine Patricia, MD  1 tablet at 07/21/15 2321  . insulin aspart (novoLOG) injection 0-9 Units  0-9 Units Subcutaneous TID WC Albertine Patricia, MD   0 Units at 07/22/15 0850  . ondansetron (ZOFRAN) tablet 4 mg  4 mg Oral Q6H PRN Albertine Patricia, MD       Or  . ondansetron (ZOFRAN) injection 4 mg  4 mg Intravenous Q6H PRN Silver Huguenin Elgergawy, MD      . oseltamivir (TAMIFLU) capsule 30 mg  30 mg Oral BID Albertine Patricia, MD   30 mg at 07/22/15 1156  . oxybutynin (DITROPAN) tablet 5 mg  5 mg Oral Daily Albertine Patricia, MD   5 mg at 07/22/15 1153  . rOPINIRole (REQUIP) tablet 0.25 mg  0.25 mg Oral QHS Silver Huguenin Elgergawy, MD   0.25 mg at 07/21/15 2321  . simethicone (MYLICON)  [id number] suspension 80 mg  80 mg Oral QID PRN Albertine Patricia, MD   80 mg at 07/22/15 1156  . tamsulosin (FLOMAX) capsule 0.4 mg  0.4 mg Oral Daily Albertine Patricia, MD   0.4 mg at 07/22/15 1153     Discharge Medications: Please see discharge summary for a list of discharge medications.  Relevant Imaging Results:  Relevant Lab Results:   Additional Information SSN: [id number]  Harlon Flor, Student-SW [phone]

## 2015-07-22 NOTE — Clinical Social Work Placement (Signed)
   CLINICAL SOCIAL WORK PLACEMENT  NOTE  Date:  07/22/2015  Patient Details  Name: Mark Valenzuela MRN: [id number] Date of Birth: Oct 20, 1922  Clinical Social Work is seeking post-discharge placement for this patient at the [place] level of care (*CSW will initial, date and re-position this form in  chart as items are completed):  Yes   Patient/family provided with [facility] Work Department's list of facilities offering this level of care within the geographic area requested by the patient (or if unable, by the patient's family).  Yes   Patient/family informed of their freedom to choose among providers that offer the needed level of care, that participate in Medicare, Medicaid or managed care program needed by the patient, have an available bed and are willing to accept the patient.  Yes   Patient/family informed of [facility]'s ownership interest in [facility] and [facility], as well as of the fact that they are under no obligation to receive care at these facilities.  PASRR submitted to EDS on 07/22/15     PASRR number received on 07/22/15     Existing PASRR number confirmed on       FL2 transmitted to all facilities in geographic area requested by pt/family on 07/22/15     FL2 transmitted to all facilities within larger geographic area on 07/22/15     Patient informed that his/her managed care company has contracts with or will negotiate with certain facilities, including the following:            Patient/family informed of bed offers received.  Patient chooses bed at       Physician recommends and patient chooses bed at      Patient to be transferred to   on  .  Patient to be transferred to facility by       Patient family notified on   of transfer.  Name of family member notified:        PHYSICIAN       Additional Comment:    _______________________________________________ Harlon Flor, Student-SW 07/22/2015, 4:51 PM

## 2015-07-22 NOTE — Clinical Social Work Note (Signed)
Clinical Social Work Assessment  Patient Details  Name: Mark Valenzuela MRN: [id number] Date of Birth: Oct 20, 1922  Date of referral:  07/22/15               Reason for consult:  Facility Placement                Permission sought to share information with:  Family Supports Permission granted to share information::  Yes, Verbal Permission Granted  Name::     [name]  Agency::     Relationship::  Daughter  Contact Information:     Housing/Transportation Living arrangements for the past 2 months:  Single Family Home Source of Information:  Patient Patient Interpreter Needed:  None Criminal Activity/Legal Involvement Pertinent to Current Situation/Hospitalization:  No - Comment as needed Significant Relationships:  Adult Children, Spouse Lives with:  Spouse Do you feel safe going back to the place where you live?  Yes Need for family participation in patient care:  No (Coment)  Care giving concerns:  Pt admitted from home with spouse. PT recommending short-term rehab at a SNF.   Social Worker assessment / plan:  CSW received recommendation from PT for pt for SNF. BSW Intern met with pt at bedside. Pt explained he is hard of hearing. Pt states he lives at home with wife. Pt believes his wife and daughter are able to care for him at home. BSW Intern explained recommendation from PT for short-term rehab. Pt requested BSW Intern to call daughter for questions and to discuss PT recommendation.   BSW Intern called pt daughter, [name] and left a vm.   CSW to await contact with daughter to discuss PT recommendation for further dc planning.  CSW continuing to follow.  Employment status:  Retired Nurse, adult PT Recommendations:  [place] / Referral to community resources:  [place]  Patient/Family's Response to care:  Pt is alert and oriented x4. Pt discussed feeling better. Pt is pleased with the care he is  receiving in the hospital. Pt is active in conversation and pleasant to speak with.  Patient/Family's Understanding of and Emotional Response to Diagnosis, Current Treatment, and Prognosis:  Pt reports no further questions at this time.   Emotional Assessment Appearance:  Appears stated age Attitude/Demeanor/Rapport:  Other (Appropriate) Affect (typically observed):  Pleasant, Stable Orientation:  Oriented to Self, Oriented to Place, Oriented to  Time, Oriented to Situation Alcohol / Substance use:  Not Applicable Psych involvement (Current and /or in the community):  No (Comment)  Discharge Needs  Concerns to be addressed:  Care Coordination Readmission within the last 30 days:  No Current discharge risk:  None Barriers to Discharge:  Continued Medical Work up   [employer], Student-SW 07/22/2015, 3:57 PM

## 2015-07-22 NOTE — Evaluation (Addendum)
Physical Therapy Evaluation Patient Details Name: Mark Valenzuela MRN: [id number] DOB: Jun 02, 1922 Today's Date: 07/22/2015   History of Present Illness  82 y.o. male with h/o DM, HTN, prostate cancer, neuropathy admitted with weakness, fatigue, fever,  diarrhea. Dx: sepsis, ARF, flu.  Clinical Impression  Pt admitted with above diagnosis. Pt currently with functional limitations due to the deficits listed below (see PT Problem List). Max assist for supine to sit, min assist for sit to stand and to take several pivotal steps with RW to recliner. Baseline not clear as pt is very HOH and unable to hear my questions. Activity limited by LLE pain (pt stated he needed a joint replacement awhile ago but didn't get one) and fatigue. May need ST-SNF depending of progress.  Pt will benefit from skilled PT to increase their independence and safety with mobility to allow discharge to the venue listed below.       Follow Up Recommendations SNF    Equipment Recommendations   (TBD -depending on progress)    Recommendations for Other Services       Precautions / Restrictions Precautions Precautions: Fall Restrictions Weight Bearing Restrictions: No      Mobility  Bed Mobility Overal bed mobility: Needs Assistance Bed Mobility: Supine to Sit     Supine to sit: HOB elevated;Max assist     General bed mobility comments: assist to raise trunk and pivot hips with pad, pt 50%, limited by LLE pain with movement  Transfers Overall transfer level: Needs assistance Equipment used: Rolling walker (2 wheeled) Transfers: Sit to/from Stand Sit to Stand: Min assist         General transfer comment: min A to rise, pt pushed up with BUEs on RW  Ambulation/Gait Ambulation/Gait assistance: Min assist Ambulation Distance (Feet): 3 Feet Assistive device: Rolling walker (2 wheeled) Gait Pattern/deviations: Step-to pattern;Decreased step length - right;Decreased step length - left     General Gait  Details: pivotal steps from bed to recliner with RW, distance limited by LLE pain, no LOB; increased respiratory rate at rest and with activity, SaO2 93% on RA  Stairs            Wheelchair Mobility    Modified Rankin (Stroke Patients Only)       Balance Overall balance assessment: Needs assistance   Sitting balance-Leahy Scale: Fair       Standing balance-Leahy Scale: Poor Standing balance comment: relies on BUE support                             Pertinent Vitals/Pain Pain Assessment: Faces Faces Pain Scale: Hurts even more Pain Location: LLE, with activity Pain Intervention(s): Monitored during session;Limited activity within patient's tolerance;Repositioned    Home Living Family/patient expects to be discharged to:: Private residence Living Arrangements: Spouse/significant other Available Help at Discharge: Family           Home Equipment: Gilford Rile - 2 wheels Additional Comments: pt very HOH so difficult to obtain detailed info, per chart he lives with his wife    Prior Function           Comments: used walker PTA, unable to obtain further details due to [place]        Extremity/Trunk Assessment   Upper Extremity Assessment: Overall WFL for tasks assessed           Lower Extremity Assessment:  (difficult to assess due to [facility], pt  reports his LLE is "bad", that he was supposed to have a joint replacement but didn't, LLE painful with movement)      Cervical / Trunk Assessment: Normal  Communication   Communication: HOH  Cognition Arousal/Alertness: Awake/alert Behavior During Therapy: WFL for tasks assessed/performed Overall Cognitive Status: Difficult to assess (seems to be [facility] overall)                      General Comments      Exercises        Assessment/Plan    PT Assessment Patient needs continued PT services  PT Diagnosis Difficulty walking;Generalized weakness   PT Problem List  Decreased activity tolerance;Pain;Decreased balance;Decreased mobility  PT Treatment Interventions Gait training;Functional mobility training;Therapeutic activities;Patient/family education;Therapeutic exercise;Balance training   PT Goals (Current goals can be found in the Care Plan section) Acute Rehab PT Goals Patient Stated Goal: none stated PT Goal Formulation: Patient unable to participate in goal setting Time For Goal Achievement: 08/05/15 Potential to Achieve Goals: Fair    Frequency Min 3X/week   Barriers to discharge        Co-evaluation               End of Session Equipment Utilized During Treatment: Gait belt Activity Tolerance: Patient limited by pain;Patient limited by fatigue Patient left: in chair;with call bell/phone within reach;with chair alarm set Nurse Communication: Mobility status         Time: [id number] PT Time Calculation (min) (ACUTE ONLY): 19 min   Charges:   PT Evaluation $PT Eval Moderate Complexity: 1 Procedure     PT G Codes:  PT G-Codes **NOT FOR INPATIENT CLASS** Functional Assessment Tool Used clinical judgement clinical judgement at 1501 on 07/22/15 by Lucile Crater, PT clinical judgement clinical judgement at 0915 on 07/23/15 by Lucile Crater, PT Functional Limitation Mobility: Walking and moving around Mobility: Walking and moving around at 1501 on 07/22/15 by Lucile Crater, PT Mobility: Walking and moving around Mobility: Walking and moving around at 0915 on 07/23/15 by Lucile Crater, PT Mobility: Walking and Moving Around Current Status [phone]) At least 40 percent but less than 60 percent impaired, limited or restricted CK at 1501 on 07/22/15 by Lucile Crater, PT Mobility: Walking and Moving Around Goal Status [phone]) At least 20 percent but less than 40 percent impaired, limited or restricted CJ at 1501 on 07/22/15 by Lucile Crater, PT Mobility: Walking and Moving Around  Discharge Status [phone])       Philomena Doheny 07/22/2015, 3:01 PM [phone]

## 2015-07-22 NOTE — NC FL2 (Signed)
[place] LEVEL OF CARE SCREENING TOOL     IDENTIFICATION  Patient Name: Mark Valenzuela Birthdate: 11-Jul-1922 Sex: male Admission Date (Current Location): 07/21/2015  [facility] and [place] Number:  Herbalist and Address:  [facility],  [place] [address] [place]      Provider Number: [id number]  Attending Physician Name and Address:  Albertine Patricia, MD  Relative Name and Phone Number:       Current Level of Care: Hospital Recommended Level of Care: [place] Prior Approval Number:    Date Approved/Denied:   PASRR Number: [id number]  Discharge Plan: SNF    Current Diagnoses: Patient Active Problem List   Diagnosis Date Noted  . Sepsis ([place]) 07/21/2015  . Fever 07/21/2015  . Hypertension 07/21/2015  . Diabetes mellitus type 2 in nonobese ([place]) 07/21/2015  . Acute renal failure (ARF) ([place]) 07/21/2015    Orientation RESPIRATION BLADDER Height & Weight     Self, Time, Situation, Place  Normal Incontinent, External catheter (Condom Catheter) Weight: 172 lb 9.9 oz (78.3 kg) Height:  5\' 8"  (172.7 cm)  BEHAVIORAL SYMPTOMS/MOOD NEUROLOGICAL BOWEL NUTRITION STATUS  Other (Comment) (None)  (N/a) Continent Diet (Heart Room service appropriate; Fluid consistency: Thin )  AMBULATORY STATUS COMMUNICATION OF NEEDS Skin   Limited Assist Verbally Normal                       Personal Care Assistance Level of Assistance  Bathing, Feeding, Dressing Bathing Assistance: Limited assistance Feeding assistance: Independent Dressing Assistance: Limited assistance     Functional Limitations Info  Sight, Hearing, Speech Sight Info: Adequate Hearing Info: Adequate Speech Info: Adequate    SPECIAL CARE FACTORS FREQUENCY  PT (By licensed PT), OT (By licensed OT)     PT Frequency: 5x week OT Frequency: 5x week            Contractures Contractures Info: Not present    Additional Factors Info  Isolation  Precautions Code Status Info: FULL Allergies Info: No Known Allergies     Isolation Precautions Info: Droplet and Enteric precautions (UV disinfection)     Current Medications (07/22/2015):  This is the current hospital active medication list Current Facility-Administered Medications  Medication Dose Route Frequency Provider Last Rate Last Dose  . 0.9 %  sodium chloride infusion   Intravenous Continuous Albertine Patricia, MD 50 mL/hr at 07/22/15 1525    . acetaminophen (TYLENOL) tablet 650 mg  650 mg Oral Q6H PRN Silver Huguenin Elgergawy, MD      . albuterol (PROVENTIL) (2.5 MG/3ML) 0.083% nebulizer solution 2.5 mg  2.5 mg Nebulization Q2H PRN Albertine Patricia, MD      . aspirin EC tablet 81 mg  81 mg Oral Daily Albertine Patricia, MD   81 mg at 07/22/15 1154  . atorvastatin (LIPITOR) tablet 10 mg  10 mg Oral Daily Albertine Patricia, MD   10 mg at 07/22/15 1153  . cloNIDine (CATAPRES) tablet 0.2 mg  0.2 mg Oral BID Albertine Patricia, MD   0.2 mg at 07/22/15 1153  . gabapentin (NEURONTIN) capsule 300 mg  300 mg Oral TID Albertine Patricia, MD   300 mg at 07/22/15 1526  . heparin injection 5,000 Units  5,000 Units Subcutaneous 3 times per day Albertine Patricia, MD   5,000 Units at 07/22/15 1423  . HYDROcodone-acetaminophen (NORCO/VICODIN) 5-325 MG per tablet 1 tablet  1 tablet Oral Q8H PRN  Albertine Patricia, MD   1 tablet at 07/21/15 2321  . insulin aspart (novoLOG) injection 0-9 Units  0-9 Units Subcutaneous TID WC Albertine Patricia, MD   0 Units at 07/22/15 0850  . ondansetron (ZOFRAN) tablet 4 mg  4 mg Oral Q6H PRN Albertine Patricia, MD       Or  . ondansetron (ZOFRAN) injection 4 mg  4 mg Intravenous Q6H PRN Silver Huguenin Elgergawy, MD      . oseltamivir (TAMIFLU) capsule 30 mg  30 mg Oral BID Albertine Patricia, MD   30 mg at 07/22/15 1156  . oxybutynin (DITROPAN) tablet 5 mg  5 mg Oral Daily Albertine Patricia, MD   5 mg at 07/22/15 1153  . rOPINIRole (REQUIP) tablet 0.25 mg  0.25 mg Oral  QHS Silver Huguenin Elgergawy, MD   0.25 mg at 07/21/15 2321  . simethicone (MYLICON) 40 [id number] suspension 80 mg  80 mg Oral QID PRN Albertine Patricia, MD   80 mg at 07/22/15 1156  . tamsulosin (FLOMAX) capsule 0.4 mg  0.4 mg Oral Daily Albertine Patricia, MD   0.4 mg at 07/22/15 1153     Discharge Medications: Please see discharge summary for a list of discharge medications.  Relevant Imaging Results:  Relevant Lab Results:   Additional Information SSN: [id number]  Harlon Flor, Student-SW [phone]

## 2015-07-22 NOTE — Progress Notes (Addendum)
Patient Demographics  Mark Valenzuela, is a 82 y.o. male, DOB - [birth year], [id number]  Admit date - 07/21/2015   Admitting Physician Albertine Patricia, MD  Outpatient Primary MD for the patient is [name], MD  LOS -    Chief Complaint  Patient presents with  . Code Sepsis       Admission HPI/Brief narrative: 82 y.o. male, past medical history of diabetes mellitus, hypertension, prostate cancer, diabetic neuropathy, is at home with his wife, presents with complaints of generalized weakness, fatigue, poor appetite, fever and chills, and diarrhea, has fever 104.1, urinalysis, chest x-ray with no acute infiltrate, patient influenza PCR positive. Subjective:   Kimberley Co[place] today has, No headache, No chest pain, typical complaints of poor appetite and generalized weakness , as well reports abdominal pain, and nausea this a.m., he already had a BM this morning . Assessment & Plan    Active Problems:   Sepsis ([place])   Fever   Hypertension   Diabetes mellitus type 2 in nonobese (HCC)   Acute renal failure (ARF) (HCC)  Fever - presents with fever, , has negative urinalysis, chest x-ray with no evidence of infection, no nuchal rigidity and no evidence of meningeal sign. -  He is influenza positive, fever most likely due to influenza, continue with Tamiflu. - Blood culture remains negative, fever most likely related to influenza, will discontinue IV vancomycin and Zosyn - continue with IV fluids - Follow on blood cultures - will follow on GI panel given his diarrhea   Abdominal pain - Patient with complaints of abdominal pain this a.m., KUB with no evidence of obstruction or free air, will start on simethicone, continue to monitor closely, will resume his diet and observe. -no  Further diarrhea, follow on GI pathogens panel  Acute renal failure -Likely related to volume depletion from poor oral  intake, proving with IV fluids.  Hypertension  -Blood pressure acceptable, continue with home medication   diabetes mellitus  - We'll hold oral hypoglycemic agent, patient an episode of hypoglycemia this a.m., continue to monitor on  insulin sliding scale - will keep in dysphagia 2 diet giving poor dentition .   Diabetic neuropathy  - Continue with  Neurontin   Hyperlipidemia  -Continue with statin   Hypokalemia - Repleted Code Status: Full  Family Communication: Discussed with patient, left daughter message via phone  Disposition Plan: pending PT valuation   Procedures  None   Consults   None   Medications  Scheduled Meds: . aspirin EC  81 mg Oral Daily  . atorvastatin  10 mg Oral Daily  . cloNIDine  0.2 mg Oral BID  . gabapentin  300 mg Oral TID  . heparin  5,000 Units Subcutaneous 3 times per day  . insulin aspart  0-9 Units Subcutaneous TID WC  . oseltamivir  30 mg Oral BID  . oxybutynin  5 mg Oral Daily  . piperacillin-tazobactam (ZOSYN)  IV  3.375 g Intravenous 3 times per day  . rOPINIRole  0.25 mg Oral QHS  . tamsulosin  0.4 mg Oral Daily   Continuous Infusions: . sodium chloride 75 mL/hr at 07/21/15 1857   PRN Meds:.acetaminophen, albuterol, HYDROcodone-acetaminophen, ondansetron **OR** ondansetron (ZOFRAN) IV, simethicone  DVT Prophylaxis  -  Heparin -   Lab Results  Component Value Date   PLT PLATELET CLUMPS NOTED ON SMEAR, UNABLE TO ESTIMATE 07/21/2015    Antibiotics    Anti-infectives    Start     Dose/Rate Route Frequency Ordered Stop   07/22/15 1400  vancomycin (VANCOCIN) IVPB 1000 mg/200 mL premix  Status:  Discontinued     1,000 mg 200 mL/hr over 60 Minutes Intravenous Every 24 hours 07/21/15 1601 07/22/15 1444   07/21/15 2200  piperacillin-tazobactam (ZOSYN) IVPB 3.375 g     3.375 g 12.5 mL/hr over 240 Minutes Intravenous 3 times per day 07/21/15 1601     07/21/15 1830  oseltamivir (TAMIFLU) capsule 30 mg     30 mg Oral 2  times daily 07/21/15 1743 07/26/15 2159   07/21/15 1445  piperacillin-tazobactam (ZOSYN) IVPB 3.375 g     3.375 g 100 mL/hr over 30 Minutes Intravenous  Once 07/21/15 1438 07/21/15 1521   07/21/15 1445  vancomycin (VANCOCIN) IVPB 1000 mg/200 mL premix     1,000 mg 200 mL/hr over 60 Minutes Intravenous  Once 07/21/15 1438 07/21/15 1553          Objective:   Filed Vitals:   07/21/15 1800 07/21/15 2227 07/22/15 0624 07/22/15 1224  BP: 160/80 183/70 161/82 183/85  Pulse: 86 74 68 69  Temp: 98.9 F (37.2 C) 97.9 F (36.6 C) 98.2 F (36.8 C) 98.7 F (37.1 C)  TempSrc: Oral Axillary Axillary Oral  Resp: 24 22 20 20   Height: 5\' 8"  (1.727 m)     Weight: 78.3 kg (172 lb 9.9 oz)     SpO2: 94% 90% 97% 96%    Wt Readings from Last 3 Encounters:  07/21/15 78.3 kg (172 lb 9.9 oz)     Intake/Output Summary (Last 24 hours) at 07/22/15 1444 Last data filed at 07/22/15 1231  Gross per 24 hour  Intake      0 ml  Output   2502 ml  Net  -2502 ml     Physical Exam  Awake Alert, Oriented X 3, No new F.N deficits, Normal affect [place].AT,PERRAL Supple Neck,No JVD, No cervical lymphadenopathy appriciated.  Symmetrical Chest wall movement, Good air movement bilaterally, CTAB RRR,No Gallops,Rubs or new Murmurs, No Parasternal Heave +ve B.Sounds,abdomen with mild distention, diffuse tenderness, but no rebound no guarding No Cyanosis, Clubbing or edema, No new Rash or bruise    Data Review   Micro Results Recent Results (from the past 240 hour(s))  Culture, blood (routine x 2)     Status: None (Preliminary result)   Collection Time: 07/21/15  2:22 PM  Result Value Ref Range Status   Specimen Description BLOOD RIGHT FOREARM  Final   Special Requests   Final    BOTTLES DRAWN AEROBIC AND ANAEROBIC 5CC BOTH BOTTLES   Culture   Final    NO GROWTH < 24 HOURS Performed at [facility]    Report Status PENDING  Incomplete  Culture, blood (routine x 2)     Status: None (Preliminary  result)   Collection Time: 07/21/15  2:22 PM  Result Value Ref Range Status   Specimen Description BLOOD RIGHT ARM  Final   Special Requests   Final    BOTTLES DRAWN AEROBIC AND ANAEROBIC 5CC BOTH BOTTLES   Culture   Final    NO GROWTH < 24 HOURS Performed at [facility]    Report Status PENDING  Incomplete  Urine culture     Status: None (Preliminary  result)   Collection Time: 07/21/15  3:14 PM  Result Value Ref Range Status   Specimen Description URINE, CATHETERIZED  Final   Special Requests NONE  Final   Culture   Final    NO GROWTH < 24 HOURS Performed at [facility]    Report Status PENDING  Incomplete  C difficile quick scan w PCR reflex     Status: None   Collection Time: 07/22/15  8:10 AM  Result Value Ref Range Status   C Diff antigen NEGATIVE NEGATIVE Final   C Diff toxin NEGATIVE NEGATIVE Final   C Diff interpretation Negative for toxigenic C. difficile  Final    Radiology Reports Dg Chest 2 View  07/21/2015  CLINICAL DATA:  Generalized weakness with diarrhea and shortness of breath. EXAM: CHEST  2 VIEW COMPARISON:  Chest radiographs 01/17/2001 and 07/28/2005. Lumbar spine radiographs 01/31/2014. FINDINGS: There are lower lung volumes. Allowing for this, the heart size and mediastinal contours are stable with mild vascular tortuosity. There is diffuse central airway thickening without focal airspace disease or edema. The previously demonstrated right lung nodule is not well visualized. There is no pleural effusion or pneumothorax. Mild generalized osteosclerosis is suggested (not definite). IMPRESSION: 1. Low lung volumes with generalized central airway thickening. No focal airspace disease or edema. 2. Suggested mild generalized osteosclerosis. In this patient with apparent prostate cancer, this could reflect metastatic disease. Correlation with serum PSA recommended. Electronically Signed   By: Richardean Sale M.D.   On: 07/21/2015 15:47   Dg Abd Portable  2v  07/22/2015  CLINICAL DATA:  Abdominal pain with weakness and diarrhea. EXAM: PORTABLE ABDOMEN - 2 VIEW COMPARISON:  Lumbar spine and hip radiographs 01/31/2014. FINDINGS: Gas is present throughout the small and large bowel. There are scattered air-fluid levels within the colon on the decubitus view. No significant bowel distention, wall thickening or free intraperitoneal air demonstrated. Severe degenerative changes are again noted at both hips. There are mild degenerative changes within the lumbar spine. Prostate brachytherapy seeds are noted. IMPRESSION: Nonobstructive bowel gas pattern. Scattered air-fluid levels within the colon, nonspecific in the setting of diarrhea. Severe osteoarthritis of both hips. Electronically Signed   By: Richardean Sale M.D.   On: 07/22/2015 09:47     CBC  Recent Labs Lab 07/21/15 1431  WBC 5.5  HGB 13.4  HCT 40.6  PLT PLATELET CLUMPS NOTED ON SMEAR, UNABLE TO ESTIMATE  MCV 86.2  MCH 28.5  MCHC 33.0  RDW 14.9  LYMPHSABS 0.7  MONOABS 0.4  EOSABS 0.0  BASOSABS 0.0    Chemistries   Recent Labs Lab 07/21/15 1431 07/22/15 0453  NA 137 142  K 3.1* 3.8  CL 102 113*  CO2 23 22  GLUCOSE 89 87  BUN 18 15  CREATININE 1.58* 1.23  CALCIUM 8.6* 7.9*  MG  --  1.9  AST 82* 80*  ALT 32 27  ALKPHOS 35* 29*  BILITOT 0.9 1.0   ------------------------------------------------------------------------------------------------------------------ estimated creatinine clearance is 37.8 mL/min (by C-G formula based on Cr of 1.23). ------------------------------------------------------------------------------------------------------------------ No results for input(s): HGBA1C in the last 72 hours. ------------------------------------------------------------------------------------------------------------------ No results for input(s): CHOL, HDL, LDLCALC, TRIG, CHOLHDL, LDLDIRECT in the last 72  hours. ------------------------------------------------------------------------------------------------------------------ No results for input(s): TSH, T4TOTAL, T3FREE, THYROIDAB in the last 72 hours.  Invalid input(s): FREET3 ------------------------------------------------------------------------------------------------------------------ No results for input(s): VITAMINB12, FOLATE, FERRITIN, TIBC, IRON, RETICCTPCT in the last 72 hours.  Coagulation profile  Recent Labs Lab 07/21/15 1431  INR 1.06  No results for input(s): DDIMER in the last 72 hours.  Cardiac Enzymes No results for input(s): CKMB, TROPONINI, MYOGLOBIN in the last 168 hours.  Invalid input(s): CK ------------------------------------------------------------------------------------------------------------------ Invalid input(s): POCBNP     Time Spent in minutes   25 minutes   Mihran Lebarron M.D on 07/22/2015 at 2:44 PM  Between 7am to 7pm - Pager - [phone]  After 7pm go to [email or web address] - password [facility]  [facility]   Office  [phone]

## 2015-07-23 DIAGNOSIS — R600 Localized edema: Secondary | ICD-10-CM | POA: Diagnosis not present

## 2015-07-23 DIAGNOSIS — J111 Influenza due to unidentified influenza virus with other respiratory manifestations: Secondary | ICD-10-CM | POA: Diagnosis not present

## 2015-07-23 DIAGNOSIS — E119 Type 2 diabetes mellitus without complications: Secondary | ICD-10-CM | POA: Diagnosis not present

## 2015-07-23 DIAGNOSIS — E1142 Type 2 diabetes mellitus with diabetic polyneuropathy: Secondary | ICD-10-CM | POA: Diagnosis not present

## 2015-07-23 DIAGNOSIS — M25562 Pain in left knee: Secondary | ICD-10-CM | POA: Diagnosis not present

## 2015-07-23 DIAGNOSIS — N189 Chronic kidney disease, unspecified: Secondary | ICD-10-CM | POA: Diagnosis not present

## 2015-07-23 DIAGNOSIS — Z8546 Personal history of malignant neoplasm of prostate: Secondary | ICD-10-CM | POA: Diagnosis not present

## 2015-07-23 DIAGNOSIS — R938 Abnormal findings on diagnostic imaging of other specified body structures: Secondary | ICD-10-CM | POA: Diagnosis not present

## 2015-07-23 DIAGNOSIS — G2581 Restless legs syndrome: Secondary | ICD-10-CM | POA: Diagnosis not present

## 2015-07-23 DIAGNOSIS — I10 Essential (primary) hypertension: Secondary | ICD-10-CM | POA: Diagnosis not present

## 2015-07-23 DIAGNOSIS — G4700 Insomnia, unspecified: Secondary | ICD-10-CM | POA: Diagnosis not present

## 2015-07-23 DIAGNOSIS — M792 Neuralgia and neuritis, unspecified: Secondary | ICD-10-CM | POA: Diagnosis not present

## 2015-07-23 DIAGNOSIS — R5381 Other malaise: Secondary | ICD-10-CM | POA: Diagnosis not present

## 2015-07-23 DIAGNOSIS — E0822 Diabetes mellitus due to underlying condition with diabetic chronic kidney disease: Secondary | ICD-10-CM | POA: Diagnosis not present

## 2015-07-23 DIAGNOSIS — N400 Enlarged prostate without lower urinary tract symptoms: Secondary | ICD-10-CM | POA: Diagnosis not present

## 2015-07-23 DIAGNOSIS — E785 Hyperlipidemia, unspecified: Secondary | ICD-10-CM | POA: Diagnosis not present

## 2015-07-23 DIAGNOSIS — R509 Fever, unspecified: Secondary | ICD-10-CM

## 2015-07-23 DIAGNOSIS — R2681 Unsteadiness on feet: Secondary | ICD-10-CM | POA: Diagnosis not present

## 2015-07-23 DIAGNOSIS — N179 Acute kidney failure, unspecified: Secondary | ICD-10-CM

## 2015-07-23 DIAGNOSIS — R278 Other lack of coordination: Secondary | ICD-10-CM | POA: Diagnosis not present

## 2015-07-23 DIAGNOSIS — M6281 Muscle weakness (generalized): Secondary | ICD-10-CM | POA: Diagnosis not present

## 2015-07-23 DIAGNOSIS — E46 Unspecified protein-calorie malnutrition: Secondary | ICD-10-CM | POA: Diagnosis not present

## 2015-07-23 DIAGNOSIS — J101 Influenza due to other identified influenza virus with other respiratory manifestations: Secondary | ICD-10-CM | POA: Diagnosis not present

## 2015-07-23 DIAGNOSIS — G609 Hereditary and idiopathic neuropathy, unspecified: Secondary | ICD-10-CM | POA: Diagnosis not present

## 2015-07-23 LAB — GLUCOSE, CAPILLARY
GLUCOSE-CAPILLARY: 161 mg/dL — AB (ref 65–99)
Glucose-Capillary: 108 mg/dL — ABNORMAL HIGH (ref 65–99)
Glucose-Capillary: 147 mg/dL — ABNORMAL HIGH (ref 65–99)

## 2015-07-23 MED ORDER — ALBUTEROL SULFATE (2.5 MG/3ML) 0.083% IN NEBU: 2.5000 mg | INHALATION_SOLUTION | RESPIRATORY_TRACT | Status: DC | PRN

## 2015-07-23 MED ORDER — SIMETHICONE 40 MG/0.6ML PO SUSP: 80.0000 mg | Freq: Four times a day (QID) | ORAL | Status: DC | PRN

## 2015-07-23 MED ORDER — OSELTAMIVIR PHOSPHATE 30 MG PO CAPS: 30.0000 mg | ORAL_CAPSULE | Freq: Two times a day (BID) | ORAL | Status: DC

## 2015-07-23 MED ORDER — HYDROCODONE-ACETAMINOPHEN 5-325 MG PO TABS: 1.0000 | ORAL_TABLET | Freq: Three times a day (TID) | ORAL | Status: DC | PRN

## 2015-07-23 NOTE — Progress Notes (Signed)
   07/23/15 1500  Clinical Encounter Type  Visited With Patient  Visit Type Initial;Other (Comment) (ADV Directive)  Spiritual Encounters  Spiritual Needs Other (Comment) (ADV DIRECTIVE)  [place] met with pt and reviewed HCPOA and ADV DIR; [place] not able to get witnesses and notary until Thursday and will do so if pt still at [facility]; Please inform pt id D/C that document can be notarized out of hospital.  3:18 PM Gwynn Burly

## 2015-07-23 NOTE — Clinical Social Work Placement (Signed)
Patient is set to discharge to [facility] SNF today. Patient & daughter, [name] at bedside aware. Discharge packet given to RN, Gregary Signs. RN to call for transport when ready.     Raynaldo Opitz, [facility] Clinical Social Worker cell #: [phone]    CLINICAL SOCIAL WORK PLACEMENT  NOTE  Date:  07/23/2015  Patient Details  Name: Mark Valenzuela MRN: [id number] Date of Birth: January 03, 1923  Clinical Social Work is seeking post-discharge placement for this patient at the [place] level of care (*CSW will initial, date and re-position this form in  chart as items are completed):  Yes   Patient/family provided with [place] Work Department's list of facilities offering this level of care within the geographic area requested by the patient (or if unable, by the patient's family).  Yes   Patient/family informed of their freedom to choose among providers that offer the needed level of care, that participate in Medicare, Medicaid or managed care program needed by the patient, have an available bed and are willing to accept the patient.  Yes   Patient/family informed of [facility]'s ownership interest in [facility] and [facility], as well as of the fact that they are under no obligation to receive care at these facilities.  PASRR submitted to EDS on 07/22/15     PASRR number received on 07/22/15     Existing PASRR number confirmed on       FL2 transmitted to all facilities in geographic area requested by pt/family on 07/22/15     FL2 transmitted to all facilities within larger geographic area on 07/22/15     Patient informed that his/her managed care company has contracts with or will negotiate with certain facilities, including the following:        Yes   Patient/family informed of bed offers received.  Patient chooses bed at [facility]     Physician recommends and patient chooses bed at      Patient to be transferred  to [facility] on 07/23/15.  Patient to be transferred to facility by [facility]     Patient family notified on 07/23/15 of transfer.  Name of family member notified:  patient's daughter, [name] at bedside     PHYSICIAN       Additional Comment:    _______________________________________________ Standley Brooking, LCSW 07/23/2015, 4:57 PM

## 2015-07-23 NOTE — Care Management Note (Signed)
Case Management Note  Patient Details  Name: Mark Valenzuela MRN: [id number] Date of Birth: 07-Sep-1922  Subjective/Objective:                    Action/Plan:d/c SNF.   Expected Discharge Date:   (unknown)               Expected Discharge Plan:  Skilled Nursing Facility  In-House Referral:  Clinical Social Work  Discharge planning Services  CM Consult  Post Acute Care Choice:    Choice offered to:     DME Arranged:    DME Agency:     HH Arranged:    [place] Agency:     Status of Service:  Completed, signed off  Medicare Important Message Given:    Date Medicare IM Given:    Medicare IM give by:    Date Additional Medicare IM Given:    Additional Medicare Important Message give by:     If discussed at [place] of Stay Meetings, dates discussed:    Additional Comments:  Dessa Phi, RN 07/23/2015, 2:39 PM

## 2015-07-23 NOTE — Clinical Social Work Placement (Signed)
CSW met with patient, wife & daughter [name] at bedside re: discharge planning. Patient & family are agreeable with plan for SNF - has accepted bed offer at [facility].     Raynaldo Opitz, [facility] Clinical Social Worker cell #: [phone]    CLINICAL SOCIAL WORK PLACEMENT  NOTE  Date:  07/23/2015  Patient Details  Name: Mark Valenzuela MRN: [id number] Date of Birth: 20-Sep-1922  Clinical Social Work is seeking post-discharge placement for this patient at the [place] level of care (*CSW will initial, date and re-position this form in  chart as items are completed):  Yes   Patient/family provided with [place] Work Department's list of facilities offering this level of care within the geographic area requested by the patient (or if unable, by the patient's family).  Yes   Patient/family informed of their freedom to choose among providers that offer the needed level of care, that participate in Medicare, Medicaid or managed care program needed by the patient, have an available bed and are willing to accept the patient.  Yes   Patient/family informed of [place]'s ownership interest in [facility] and [facility], as well as of the fact that they are under no obligation to receive care at these facilities.  PASRR submitted to EDS on 07/22/15     PASRR number received on 07/22/15     Existing PASRR number confirmed on       FL2 transmitted to all facilities in geographic area requested by pt/family on 07/22/15     FL2 transmitted to all facilities within larger geographic area on 07/22/15     Patient informed that his/her managed care company has contracts with or will negotiate with certain facilities, including the following:        Yes   Patient/family informed of bed offers received.  Patient chooses bed at       Physician recommends and patient chooses bed at      Patient to be transferred to   on   .  Patient to be transferred to facility by       Patient family notified on   of transfer.  Name of family member notified:        PHYSICIAN       Additional Comment:    _______________________________________________ Standley Brooking, LCSW 07/23/2015, 9:01 AM

## 2015-07-23 NOTE — Care Management Obs Status (Signed)
[place] NOTIFICATION   Patient Details  Name: MARSHALL ELLIFF MRN: [id number] Date of Birth: 1922-07-28   Medicare Observation Status Notification Given:  Yes    MahabirJuliann Pulse, RN 07/23/2015, 1:50 PM

## 2015-07-23 NOTE — Discharge Summary (Signed)
Physician Discharge Summary  Mark Valenzuela [id number] DOB: 03/14/1923 DOA: 07/21/2015  PCP: [name], MD  Admit date: 07/21/2015 Discharge date: 07/23/2015  Time spent: 55 minutes  Recommendations for Outpatient Follow-up:  1. Resume hypoglycemics when able  Discharge Condition: stable    Discharge Diagnoses:  Principal Problem:   Influenza with respiratory manifestation Active Problems:   Fever   Hypertension   Diabetes mellitus type 2 in nonobese (HCC)   Acute renal failure (ARF) (HCC)   History of present illness:  82 y.o. male, past medical history of diabetes mellitus, hypertension, prostate cancer, diabetic neuropathy, is at home with his wife, presents with complaints of generalized weakness, fatigue, poor appetite, fever and chills, and diarrhea, has fever 104.1, urinalysis, chest x-ray with no acute infiltrate, patient influenza PCR positive.  Hospital Course:  Fever/ Influenza A - presents with fever (but also had diarrhea) -- continue with Tamiflu- last dose will be on the evening of 3/10  has negative urinalysis, chest x-ray with no evidence of infection, no nuchal rigidity and no evidence of meningeal sign. - Blood culture remains negative- have discontinue IV vancomycin and Zosyn - blood cultures negative   Abdominal pain/ diarrhea - complaints of abdominal pain on 3/7, KUB with no evidence of obstruction or free air -no further diarrhea-  GI pathogens panel and c diff negative  Acute renal failure -Likely related to volume depletion from poor oral intake, improved with IV fluids.  Hypertension  - continue with home medication   diabetes mellitus  -  holding Amaryl and Glyburide - he had an episode of hypoglycemia with glucose of 62 on 3/7 therefore will continue to hold oral hypoglycemics on discharge today - recommend CBG AC HS  Diabetic neuropathy  - Continue with Neurontin   Hyperlipidemia  -Continue with statin   Hypokalemia -  Repleted   Discharge Exam: Filed Weights   07/21/15 1427 07/21/15 1800  Weight: 77.111 kg (170 lb) 78.3 kg (172 lb 9.9 oz)   Filed Vitals:   07/23/15 1140 07/23/15 1359  BP: 156/84 164/74  Pulse: 67 63  Temp:  98.1 F (36.7 C)  Resp:  20    General: AAO x 3, no distress Cardiovascular: RRR, no murmurs  Respiratory: clear to auscultation bilaterally GI: soft, non-tender, non-distended, bowel sound positive  Discharge Instructions You were cared for by a hospitalist during your hospital stay. If you have any questions about your discharge medications or the care you received while you were in the hospital after you are discharged, you can call the unit and asked to speak with the hospitalist on call if the hospitalist that took care of you is not available. Once you are discharged, your primary care physician will handle any further medical issues. Please note that NO REFILLS for any discharge medications will be authorized once you are discharged, as it is imperative that you return to your primary care physician (or establish a relationship with a primary care physician if you do not have one) for your aftercare needs so that they can reassess your need for medications and monitor your lab values.      Discharge Instructions    Discharge instructions    Complete by:  As directed   Diabetic low sodium heart healthy diet CBC TID AC, QHS     Increase activity slowly    Complete by:  As directed             Medication List    STOP taking  these medications        glimepiride 4 MG tablet  Commonly known as:  AMARYL     glyBURIDE 5 MG tablet  Commonly known as:  DIABETA      TAKE these medications        albuterol (2.5 MG/3ML) 0.083% nebulizer solution  Commonly known as:  PROVENTIL  Take 3 mLs (2.5 mg total) by nebulization every 4 (four) hours as needed for wheezing (stop after flu resolves).     aspirin EC 81 MG tablet  Take 81 mg by mouth daily.     atorvastatin  10 MG tablet  Commonly known as:  LIPITOR  Take 10 mg by mouth daily.     cloNIDine 0.2 MG tablet  Commonly known as:  CATAPRES  Take 0.2 mg by mouth 2 (two) times daily.     gabapentin 300 MG capsule  Commonly known as:  NEURONTIN  Take 300 mg by mouth 3 (three) times daily.     HYDROcodone-acetaminophen 5-325 MG tablet  Commonly known as:  NORCO/VICODIN  Take 1 tablet by mouth every 8 (eight) hours as needed for moderate pain.     oseltamivir 30 MG capsule  Commonly known as:  TAMIFLU  Take 1 capsule (30 mg total) by mouth 2 (two) times daily.     oxybutynin 5 MG tablet  Commonly known as:  DITROPAN  Take 5 mg by mouth daily.     rOPINIRole 0.25 MG tablet  Commonly known as:  REQUIP  Take 0.25 mg by mouth at bedtime.     simethicone 40 MG/0.6ML drops  Commonly known as:  MYLICON  Take 1.2 mLs (80 mg total) by mouth 4 (four) times daily as needed for flatulence.     tamsulosin 0.4 MG Caps capsule  Commonly known as:  FLOMAX  Take 0.4 mg by mouth daily.     TYLENOL 500 MG tablet  Generic drug:  acetaminophen  Take 500 mg by mouth every 6 (six) hours as needed for mild pain.       No Known Allergies    The results of significant diagnostics from this hospitalization (including imaging, microbiology, ancillary and laboratory) are listed below for reference.    Significant Diagnostic Studies: Dg Chest 2 View  07/21/2015  CLINICAL DATA:  Generalized weakness with diarrhea and shortness of breath. EXAM: CHEST  2 VIEW COMPARISON:  Chest radiographs 01/17/2001 and 07/28/2005. Lumbar spine radiographs 01/31/2014. FINDINGS: There are lower lung volumes. Allowing for this, the heart size and mediastinal contours are stable with mild vascular tortuosity. There is diffuse central airway thickening without focal airspace disease or edema. The previously demonstrated right lung nodule is not well visualized. There is no pleural effusion or pneumothorax. Mild generalized  osteosclerosis is suggested (not definite). IMPRESSION: 1. Low lung volumes with generalized central airway thickening. No focal airspace disease or edema. 2. Suggested mild generalized osteosclerosis. In this patient with apparent prostate cancer, this could reflect metastatic disease. Correlation with serum PSA recommended. Electronically Signed   By: Richardean Sale M.D.   On: 07/21/2015 15:47   Dg Abd Portable 2v  07/22/2015  CLINICAL DATA:  Abdominal pain with weakness and diarrhea. EXAM: PORTABLE ABDOMEN - 2 VIEW COMPARISON:  Lumbar spine and hip radiographs 01/31/2014. FINDINGS: Gas is present throughout the small and large bowel. There are scattered air-fluid levels within the colon on the decubitus view. No significant bowel distention, wall thickening or free intraperitoneal air demonstrated. Severe degenerative changes are again noted at  both hips. There are mild degenerative changes within the lumbar spine. Prostate brachytherapy seeds are noted. IMPRESSION: Nonobstructive bowel gas pattern. Scattered air-fluid levels within the colon, nonspecific in the setting of diarrhea. Severe osteoarthritis of both hips. Electronically Signed   By: Richardean Sale M.D.   On: 07/22/2015 09:47    Microbiology: Recent Results (from the past 240 hour(s))  Culture, blood (routine x 2)     Status: None (Preliminary result)   Collection Time: 07/21/15  2:22 PM  Result Value Ref Range Status   Specimen Description BLOOD RIGHT FOREARM  Final   Special Requests   Final    BOTTLES DRAWN AEROBIC AND ANAEROBIC 5CC BOTH BOTTLES   Culture   Final    NO GROWTH < 24 HOURS Performed at [facility]    Report Status PENDING  Incomplete  Culture, blood (routine x 2)     Status: None (Preliminary result)   Collection Time: 07/21/15  2:22 PM  Result Value Ref Range Status   Specimen Description BLOOD RIGHT ARM  Final   Special Requests   Final    BOTTLES DRAWN AEROBIC AND ANAEROBIC 5CC BOTH BOTTLES    Culture   Final    NO GROWTH < 24 HOURS Performed at [facility]    Report Status PENDING  Incomplete  Urine culture     Status: None   Collection Time: 07/21/15  3:14 PM  Result Value Ref Range Status   Specimen Description URINE, CATHETERIZED  Final   Special Requests NONE  Final   Culture   Final    NO GROWTH 1 DAY Performed at [facility]    Report Status 07/22/2015 FINAL  Final  Gastrointestinal Panel by PCR , Stool     Status: None   Collection Time: 07/22/15  8:10 AM  Result Value Ref Range Status   Campylobacter species NOT DETECTED NOT DETECTED Final   Plesimonas shigelloides NOT DETECTED NOT DETECTED Final   Salmonella species NOT DETECTED NOT DETECTED Final   Yersinia enterocolitica NOT DETECTED NOT DETECTED Final   Vibrio species NOT DETECTED NOT DETECTED Final   Vibrio cholerae NOT DETECTED NOT DETECTED Final   Enteroaggregative E coli (EAEC) NOT DETECTED NOT DETECTED Final   Enteropathogenic E coli (EPEC) NOT DETECTED NOT DETECTED Final   Enterotoxigenic E coli (ETEC) NOT DETECTED NOT DETECTED Final   Shiga like toxin producing E coli (STEC) NOT DETECTED NOT DETECTED Final   E. coli O157 NOT DETECTED NOT DETECTED Final   Shigella/Enteroinvasive E coli (EIEC) NOT DETECTED NOT DETECTED Final   Cryptosporidium NOT DETECTED NOT DETECTED Final   Cyclospora cayetanensis NOT DETECTED NOT DETECTED Final   Entamoeba histolytica NOT DETECTED NOT DETECTED Final   Giardia lamblia NOT DETECTED NOT DETECTED Final   Adenovirus F40/41 NOT DETECTED NOT DETECTED Final   Astrovirus NOT DETECTED NOT DETECTED Final   Norovirus GI/GII NOT DETECTED NOT DETECTED Final   Rotavirus A NOT DETECTED NOT DETECTED Final   Sapovirus (I, II, IV, and V) NOT DETECTED NOT DETECTED Final  C difficile quick scan w PCR reflex     Status: None   Collection Time: 07/22/15  8:10 AM  Result Value Ref Range Status   C Diff antigen NEGATIVE NEGATIVE Final   C Diff toxin NEGATIVE NEGATIVE  Final   C Diff interpretation Negative for toxigenic C. difficile  Final     Labs: Basic Metabolic Panel:  Recent Labs Lab 07/21/15 1431 07/22/15 0453  NA  137 142  K 3.1* 3.8  CL 102 113*  CO2 23 22  GLUCOSE 89 87  BUN 18 15  CREATININE 1.58* 1.23  CALCIUM 8.6* 7.9*  MG  --  1.9  PHOS  --  2.7   Liver Function Tests:  Recent Labs Lab 07/21/15 1431 07/22/15 0453  AST 82* 80*  ALT 32 27  ALKPHOS 35* 29*  BILITOT 0.9 1.0  PROT 7.5 6.5  ALBUMIN 3.9 3.1*   No results for input(s): LIPASE, AMYLASE in the last 168 hours. No results for input(s): AMMONIA in the last 168 hours. CBC:  Recent Labs Lab 07/21/15 1431  WBC 5.5  NEUTROABS 4.4  HGB 13.4  HCT 40.6  MCV 86.2  PLT PLATELET CLUMPS NOTED ON SMEAR, UNABLE TO ESTIMATE   Cardiac Enzymes: No results for input(s): CKTOTAL, CKMB, CKMBINDEX, TROPONINI in the last 168 hours. BNP: BNP (last 3 results) No results for input(s): BNP in the last 8760 hours.  ProBNP (last 3 results) No results for input(s): PROBNP in the last 8760 hours.  CBG:  Recent Labs Lab 07/22/15 1222 07/22/15 1651 07/22/15 2050 07/23/15 0727 07/23/15 1133  GLUCAP 107* 159* 135* 108* 147*       SignedDebbe Odea, MD [facility] 07/23/2015, 2:44 PM

## 2015-07-23 NOTE — Care Management Note (Signed)
Case Management Note  Patient Details  Name: MICK ACHTER MRN: [id number] Date of Birth: 04-Feb-1923  Subjective/Objective:  Spoke to dtr [name] about OBSERVATION Status-she voiced understanding.Left form in rm.She is aware of SNF facility per CSW,she's just waiting to hear from MD.Nsg aware/MD notified.                  Action/Plan:d/c SNF.   Expected Discharge Date:   (unknown)               Expected Discharge Plan:  Skilled Nursing Facility  In-House Referral:  Clinical Social Work  Discharge planning Services  CM Consult  Post Acute Care Choice:    Choice offered to:     DME Arranged:    DME Agency:     HH Arranged:    [place] Agency:     Status of Service:  In process, will continue to follow  Medicare Important Message Given:    Date Medicare IM Given:    Medicare IM give by:    Date Additional Medicare IM Given:    Additional Medicare Important Message give by:     If discussed at [place] of Stay Meetings, dates discussed:    Additional Comments:  Dessa Phi, RN 07/23/2015, 2:09 PM

## 2015-07-23 NOTE — Progress Notes (Signed)
Patient discharged to [facility]. Transported via EMS. Family at bedside. No questions or concerns at this time.

## 2015-07-24 ENCOUNTER — Non-Acute Institutional Stay (SKILLED_NURSING_FACILITY): Payer: Medicare Other | Admitting: Internal Medicine

## 2015-07-24 ENCOUNTER — Encounter: Payer: Self-pay | Admitting: Internal Medicine

## 2015-07-24 DIAGNOSIS — Z8546 Personal history of malignant neoplasm of prostate: Secondary | ICD-10-CM

## 2015-07-24 DIAGNOSIS — R938 Abnormal findings on diagnostic imaging of other specified body structures: Secondary | ICD-10-CM

## 2015-07-24 DIAGNOSIS — E785 Hyperlipidemia, unspecified: Secondary | ICD-10-CM | POA: Diagnosis not present

## 2015-07-24 DIAGNOSIS — R9389 Abnormal findings on diagnostic imaging of other specified body structures: Secondary | ICD-10-CM

## 2015-07-24 DIAGNOSIS — E46 Unspecified protein-calorie malnutrition: Secondary | ICD-10-CM | POA: Diagnosis not present

## 2015-07-24 DIAGNOSIS — M792 Neuralgia and neuritis, unspecified: Secondary | ICD-10-CM | POA: Diagnosis not present

## 2015-07-24 DIAGNOSIS — R5381 Other malaise: Secondary | ICD-10-CM

## 2015-07-24 DIAGNOSIS — G2581 Restless legs syndrome: Secondary | ICD-10-CM

## 2015-07-24 DIAGNOSIS — J111 Influenza due to unidentified influenza virus with other respiratory manifestations: Secondary | ICD-10-CM

## 2015-07-24 DIAGNOSIS — I10 Essential (primary) hypertension: Secondary | ICD-10-CM | POA: Diagnosis not present

## 2015-07-24 DIAGNOSIS — E119 Type 2 diabetes mellitus without complications: Secondary | ICD-10-CM | POA: Diagnosis not present

## 2015-07-24 NOTE — Progress Notes (Signed)
LOCATION: [facility]  PCP: [name], MD   Code Status: Full Code  Goals of care: Advanced Directive information Advanced Directives 07/21/2015  Does patient have an advance directive? No  Would patient like information on creating an advanced directive? Yes - Spiritual care consult ordered       Extended Emergency Contact Information Primary Emergency Contact: [name],[name] Address: [address]           [address] [place] Phone: [phone] Relation: Spouse Secondary Emergency Contact: Laverta [place] Phone: [phone] Relation: Daughter   No Known Allergies  Chief Complaint  Patient presents with  . New Admit To SNF    New Admission     HPI:  Patient is a 82 y.o. male seen today for short term rehabilitation post hospital admission from 07/21/15-07/23/15 with influenza A with respiratory manifestation. He was started on iv antibiotics and tamiflu and antibiotics were later discontinued. He also had diarrhea and stool c.diff was negative. He had acute renal failure and responded well to vi fluids. He also had hypoglycemia and his oral hypoglycemics were held. He is seen in his room today. He denies any concern.   Review of Systems:  Constitutional: Negative for fever, chills, malaise and diaphoresis. Energy level returning  HENT: Negative for headache, congestion, nasal discharge, sore throat, difficulty swallowing. Positive for hearing loss in left ear.  Eyes: Negative for blurred vision, double vision and discharge.  Respiratory: Negative for shortness of breath and wheezing. Positive for cough with white phlegm.   Cardiovascular: Negative for chest pain, palpitations, leg swelling.  Gastrointestinal: Negative for heartburn, nausea, vomiting, abdominal pain, loss of appetite, melena, diarrhea and constipation. Last bowel movement was this morning.  Genitourinary: Negative for dysuria and flank pain.    Musculoskeletal: Negative for back pain, fall in the facility.  Skin: Negative for itching, rash.  Neurological: Negative for dizziness. Positive for generalized weakness. Psychiatric/Behavioral: Negative for depression, anxiety, insomnia and memory loss.    Past Medical History  Diagnosis Date  . Diabetes mellitus without complication ([place])   . Hypertension   . Cancer (HCC) approx. 2002    prostate cancer  . Neuropathy [facility])    Past Surgical History  Procedure Laterality Date  . Seed implants  approx 2002    for prostate cancer   Social History:   reports that he quit smoking about 6 weeks ago. His smoking use included Cigarettes. He has a 25 pack-year smoking history. He has never used smokeless tobacco. He reports that he does not drink alcohol or use illicit drugs.  No family history on file.  Medications:   Medication List       This list is accurate as of: 07/24/15  9:57 AM.  Always use your most recent med list.               albuterol (2.5 MG/3ML) 0.083% nebulizer solution  Commonly known as:  PROVENTIL  Take 3 mLs (2.5 mg total) by nebulization every 4 (four) hours as needed for wheezing (stop after flu resolves).     aspirin EC 81 MG tablet  Take 81 mg by mouth daily.     atorvastatin 10 MG tablet  Commonly known as:  LIPITOR  Take 10 mg by mouth daily.     cloNIDine 0.2 MG tablet  Commonly known as:  CATAPRES  Take 0.2 mg by mouth 2 (two) times daily.     gabapentin 300 MG  capsule  Commonly known as:  NEURONTIN  Take 300 mg by mouth 3 (three) times daily.     HYDROcodone-acetaminophen 5-325 MG tablet  Commonly known as:  NORCO/VICODIN  Take 1 tablet by mouth every 8 (eight) hours as needed for moderate pain.     oseltamivir 30 MG capsule  Commonly known as:  TAMIFLU  Take 1 capsule (30 mg total) by mouth 2 (two) times daily.     oxybutynin 5 MG tablet  Commonly known as:  DITROPAN  Take 5 mg by mouth daily.     rOPINIRole 0.25 MG tablet   Commonly known as:  REQUIP  Take 0.25 mg by mouth at bedtime.     simethicone 40 MG/0.6ML drops  Commonly known as:  MYLICON  Take 1.2 mLs (80 mg total) by mouth 4 (four) times daily as needed for flatulence.     tamsulosin 0.4 MG Caps capsule  Commonly known as:  FLOMAX  Take 0.4 mg by mouth daily.     TYLENOL 500 MG tablet  Generic drug:  acetaminophen  Take 500 mg by mouth every 6 (six) hours as needed for mild pain.        Immunizations: Immunization History  Administered Date(s) Administered  . PPD Test 07/23/2015     Physical Exam: Filed Vitals:   07/24/15 0944  BP: 168/99  Pulse: 68  Temp: 98.9 F (37.2 C)  TempSrc: Oral  Resp: 20  Height: 5\' 8"  (1.727 m)  Weight: 172 lb (78.019 kg)  SpO2: 95%   Body mass index is 26.16 kg/(m^2).  General- elderly male, well built, in no acute distress Head- normocephalic, atraumatic, mild periorbital swelling Nose- no maxillary or frontal sinus tenderness, no nasal discharge Throat- moist mucus membrane, missing teeth Eyes- PERRLA, EOMI, no pallor, no icterus, no discharge, normal conjunctiva, normal sclera Neck- no cervical lymphadenopathy Cardiovascular- normal s1,s2, no murmur, trace leg edema Respiratory- decreased air entry to the bases with some right sided rhonchi +, no wheeze, no crackles, no use of accessory muscles Abdomen- bowel sounds present, soft, distended but non tender Musculoskeletal- able to move all 4 extremities, generalized weakness with more weakness to lower extremity left > right noted, unsteady gait Neurological- alert and oriented to person, place and time Skin- warm and dry Psychiatry- normal mood and affect    Labs reviewed: Basic Metabolic Panel:  Recent Labs  07/21/15 1431 07/22/15 07/22/15 0453  NA 137 142 142  K 3.1*  --  3.8  CL 102  --  113*  CO2 23  --  22  GLUCOSE 89  --  87  BUN 18 15 15   CREATININE 1.58* 1.2 1.23  CALCIUM 8.6*  --  7.9*  MG  --   --  1.9  PHOS  --    --  2.7   Liver Function Tests:  Recent Labs  07/21/15 1431 07/22/15 0453  AST 82* 80*  ALT 32 27  ALKPHOS 35* 29*  BILITOT 0.9 1.0  PROT 7.5 6.5  ALBUMIN 3.9 3.1*   No results for input(s): LIPASE, AMYLASE in the last 8760 hours. No results for input(s): AMMONIA in the last 8760 hours. CBC:  Recent Labs  07/21/15 07/21/15 1431  WBC 5.5 5.5  NEUTROABS  --  4.4  HGB  --  13.4  HCT  --  40.6  MCV  --  86.2  PLT  --  PLATELET CLUMPS NOTED ON SMEAR, UNABLE TO ESTIMATE   Cardiac Enzymes: No results for input(s):  CKTOTAL, CKMB, CKMBINDEX, TROPONINI in the last 8760 hours. BNP: Invalid input(s): POCBNP CBG:  Recent Labs  07/23/15 0727 07/23/15 1133 07/23/15 1806  GLUCAP 108* 147* 161*    Radiological Exams: Dg Chest 2 View  07/21/2015  CLINICAL DATA:  Generalized weakness with diarrhea and shortness of breath. EXAM: CHEST  2 VIEW COMPARISON:  Chest radiographs 01/17/2001 and 07/28/2005. Lumbar spine radiographs 01/31/2014. FINDINGS: There are lower lung volumes. Allowing for this, the heart size and mediastinal contours are stable with mild vascular tortuosity. There is diffuse central airway thickening without focal airspace disease or edema. The previously demonstrated right lung nodule is not well visualized. There is no pleural effusion or pneumothorax. Mild generalized osteosclerosis is suggested (not definite). IMPRESSION: 1. Low lung volumes with generalized central airway thickening. No focal airspace disease or edema. 2. Suggested mild generalized osteosclerosis. In this patient with apparent prostate cancer, this could reflect metastatic disease. Correlation with serum PSA recommended. Electronically Signed   By: Richardean Sale M.D.   On: 07/21/2015 15:47   Dg Abd Portable 2v  07/22/2015  CLINICAL DATA:  Abdominal pain with weakness and diarrhea. EXAM: PORTABLE ABDOMEN - 2 VIEW COMPARISON:  Lumbar spine and hip radiographs 01/31/2014. FINDINGS: Gas is present  throughout the small and large bowel. There are scattered air-fluid levels within the colon on the decubitus view. No significant bowel distention, wall thickening or free intraperitoneal air demonstrated. Severe degenerative changes are again noted at both hips. There are mild degenerative changes within the lumbar spine. Prostate brachytherapy seeds are noted. IMPRESSION: Nonobstructive bowel gas pattern. Scattered air-fluid levels within the colon, nonspecific in the setting of diarrhea. Severe osteoarthritis of both hips. Electronically Signed   By: Richardean Sale M.D.   On: 07/22/2015 09:47    Assessment/Plan  Physical deconditioning Will have him work with physical therapy and occupational therapy team to help with gait training and muscle strengthening exercises.fall precautions. Skin care. Encourage to be out of bed.   Influenza A With respiratory manifestation. Continue and complete tamiflu on 07/25/15 evening. Droplet precautions for now. Continue prn albuterol  HTN Elevated BP, check bp bid x 1 week and then daily for a week. Continue clonidine 0.2 mg bid for now. Check bmp  RLS Continue requip  History of Prostate cancer Continue flomax to help with urine outflow. Continue ditropan for now  Neuropathy Continue neurontin 300 mg tid and monitor  HLD Continue lipitor for now  DM No results found for: HGBA1C With hypoglycemic episode, oral hypoglycemics were held in hospital. Check a1c and if > 7.5, consider treatment. cbg daily for now.   Protein calorie malnutrition Monitor weekly weight. Encourage po intake. Add procel bid for now  Chest xray changes Concerning for metastases per report. Will have him follow up with oncology for further evaluation as outpatient. Monitor clinically for now    Goals of care: short term rehabilitation   Labs/tests ordered: cbc, cmp 07/28/15  Family/ staff Communication: reviewed care plan with patient and nursing  supervisor    Blanchie Serve, MD Internal Medicine [place], [place] [address] Cell Phone (Monday-Friday 8 am - 5 pm): [phone] On Call: [phone] and follow prompts after 5 pm and on weekends Office Phone: [phone] Office Fax: [phone]

## 2015-07-26 LAB — CULTURE, BLOOD (ROUTINE X 2)
CULTURE: NO GROWTH
Culture: NO GROWTH

## 2015-07-28 DIAGNOSIS — E0822 Diabetes mellitus due to underlying condition with diabetic chronic kidney disease: Secondary | ICD-10-CM | POA: Diagnosis not present

## 2015-07-29 ENCOUNTER — Encounter: Payer: Self-pay | Admitting: Family

## 2015-07-29 ENCOUNTER — Non-Acute Institutional Stay (SKILLED_NURSING_FACILITY): Payer: Medicare Other | Admitting: Family

## 2015-07-29 DIAGNOSIS — N400 Enlarged prostate without lower urinary tract symptoms: Secondary | ICD-10-CM

## 2015-07-29 DIAGNOSIS — R600 Localized edema: Secondary | ICD-10-CM | POA: Diagnosis not present

## 2015-07-29 DIAGNOSIS — M25562 Pain in left knee: Secondary | ICD-10-CM

## 2015-07-29 DIAGNOSIS — G4700 Insomnia, unspecified: Secondary | ICD-10-CM

## 2015-07-29 NOTE — Progress Notes (Signed)
Patient ID: Mark Valenzuela, male   DOB: 1922-12-25, 82 y.o.   MRN: [id number]

## 2015-07-29 NOTE — Progress Notes (Signed)
Patient ID: Mark Valenzuela, male   DOB: Oct 14, 1922, 82 y.o.   MRN: [id number]  Location:  [place] Room Number: [id number] Place of Service:  SNF [id number]) Provider:  Blanchie Serve, MD   [name], MD  Patient Care Team: Seward Carol, MD as PCP - General (Internal Medicine)  Extended Emergency Contact Information Primary Emergency Contact: [facility] Address: [address]          [place], [place] [address] [place] Phone: [phone] Relation: Daughter Secondary Emergency Contact: Lodema Hong Address: [address]          [place] [address] [place] Phone: [phone] Relation: Spouse  Code Status:  Full Code  Goals of care: Advanced Directive information Advanced Directives 07/21/2015  Does patient have an advance directive? No  Would patient like information on creating an advanced directive? Yes - Spiritual care consult ordered     Chief Complaint  Patient presents with  . Acute Visit    insomnia, have not slept in 2 days    HPI:  Pt is a 82 y.o. male seen today at [facility]  for an acute visit for evaluation of insomnia. He has a medical history of Type 2 DM, HTN, Prostate CA, Neuropathy among others. He is seen today in his room with patient's daughter and wife at bedside. He states not able to sleep at night request something to help him sleep. He also complains of left knee currently has Hydrocodone/APAP 5/325mg  Tablet every 4 HRS but has only requested pain meds once today. Patient's daughter states he has had problems with his prostate at night request Oxybutynin 5 mg Tablet twice daily insteady of once daily.    Past Medical History  Diagnosis Date  . Diabetes mellitus without complication ([place])   . Hypertension   . Cancer (HCC) approx. 2002    prostate cancer  . Neuropathy [facility])    Past Surgical History  Procedure Laterality Date  . Seed implants  approx 2002    for  prostate cancer    No Known Allergies    Medication List       This list is accurate as of: 07/29/15  6:34 PM.  Always use your most recent med list.               albuterol (2.5 MG/3ML) 0.083% nebulizer solution  Commonly known as:  PROVENTIL  Take 3 mLs (2.5 mg total) by nebulization every 4 (four) hours as needed for wheezing (stop after flu resolves).     aspirin EC 81 MG tablet  Take 81 mg by mouth daily.     atorvastatin 10 MG tablet  Commonly known as:  LIPITOR  Take 10 mg by mouth daily.     cloNIDine 0.2 MG tablet  Commonly known as:  CATAPRES  Take 0.2 mg by mouth 2 (two) times daily.     gabapentin 300 MG capsule  Commonly known as:  NEURONTIN  Take 300 mg by mouth 3 (three) times daily.     HYDROcodone-acetaminophen 5-325 MG tablet  Commonly known as:  NORCO/VICODIN  Take 1 tablet by mouth every 8 (eight) hours as needed for moderate pain.     oxybutynin 5 MG tablet  Commonly known as:  DITROPAN  Take 5 mg by mouth daily.     rOPINIRole 0.25 MG tablet  Commonly known as:  REQUIP  Take 0.25 mg by mouth at bedtime.  simethicone 40 MG/0.6ML drops  Commonly known as:  MYLICON  Take 1.2 mLs (80 mg total) by mouth 4 (four) times daily as needed for flatulence.     tamsulosin 0.4 MG Caps capsule  Commonly known as:  FLOMAX  Take 0.4 mg by mouth daily.     TYLENOL 500 MG tablet  Generic drug:  acetaminophen  Take 500 mg by mouth every 6 (six) hours as needed for mild pain.        Review of Systems  Constitutional: Negative for fever, chills, activity change, appetite change and fatigue.  HENT: Negative.   Eyes: Negative.   Respiratory: Negative for cough, chest tightness, shortness of breath and wheezing.   Cardiovascular: Positive for leg swelling. Negative for chest pain and palpitations.  Gastrointestinal: Negative for nausea, vomiting, abdominal pain, diarrhea, constipation and abdominal distention.  Genitourinary: Negative for dysuria,  urgency, flank pain, decreased urine volume and difficulty urinating.  Musculoskeletal: Positive for gait problem. Negative for back pain.       Left knee pain   Skin: Negative.   Neurological: Negative.   Psychiatric/Behavioral: Negative for behavioral problems, confusion and agitation.    Immunization History  Administered Date(s) Administered  . PPD Test 07/23/2015   Pertinent  Health Maintenance Due  Topic Date Due  . HEMOGLOBIN A1C  05/28/1924  . FOOT EXAM  05/25/1934  . OPHTHALMOLOGY EXAM  05/25/1934  . URINE MICROALBUMIN  05/25/1934  . PNA vac Low Risk Adult (1 of 2 - PCV13) 05/25/1989  . INFLUENZA VACCINE  12/16/2014   No flowsheet data found. Functional Status Survey:    Filed Vitals:   07/29/15 1452  BP: 120/88  Pulse: 80  Temp: 97.6 F (36.4 C)  TempSrc: Oral  Resp: 18  Height: 5\' 8"  (1.727 m)  Weight: 179 lb (81.194 kg)  SpO2: 96%   Body mass index is 27.22 kg/(m^2). Physical Exam  Constitutional: He appears well-developed and well-nourished. No distress.  HENT:  Head: Normocephalic.  Mouth/Throat: Oropharynx is clear and moist.  Eyes: Conjunctivae and EOM are normal. Pupils are equal, round, and reactive to light. Right eye exhibits no discharge. Left eye exhibits no discharge. No scleral icterus.  Neck: Normal range of motion. No JVD present. No thyromegaly present.  Cardiovascular: Normal rate, regular rhythm, normal heart sounds and intact distal pulses.  Exam reveals no gallop and no friction rub.   No murmur heard. Pulmonary/Chest: Effort normal and breath sounds normal. No respiratory distress. He has no wheezes. He has no rales. He exhibits no tenderness.  Abdominal: Soft. Bowel sounds are normal. He exhibits no distension and no mass. There is no tenderness. There is no rebound and no guarding.  Musculoskeletal: He exhibits no tenderness.  Normal ROM except left knee limited due to pain. Bilateral LE's +2 edema.   Lymphadenopathy:    He has no  cervical adenopathy.  Neurological: He is alert.  Skin: Skin is warm and dry. No rash noted. No erythema. No pallor.  Psychiatric: He has a normal mood and affect.    Labs reviewed:  Recent Labs  07/21/15 1431 07/22/15 07/22/15 0453  NA 137 142 142  K 3.1*  --  3.8  CL 102  --  113*  CO2 23  --  22  GLUCOSE 89  --  87  BUN 18 15 15   CREATININE 1.58* 1.2 1.23  CALCIUM 8.6*  --  7.9*  MG  --   --  1.9  PHOS  --   --  2.7    Recent Labs  07/21/15 1431 07/22/15 0453  AST 82* 80*  ALT 32 27  ALKPHOS 35* 29*  BILITOT 0.9 1.0  PROT 7.5 6.5  ALBUMIN 3.9 3.1*    Recent Labs  07/21/15 07/21/15 1431  WBC 5.5 5.5  NEUTROABS  --  4.4  HGB  --  13.4  HCT  --  40.6  MCV  --  86.2  PLT  --  PLATELET CLUMPS NOTED ON SMEAR, UNABLE TO ESTIMATE   No results found for: TSH No results found for: HGBA1C No results found for: CHOL, HDL, LDLCALC, LDLDIRECT, TRIG, CHOLHDL  Significant Diagnostic Results in last 30 days:  Dg Chest 2 View  07/21/2015  CLINICAL DATA:  Generalized weakness with diarrhea and shortness of breath. EXAM: CHEST  2 VIEW COMPARISON:  Chest radiographs 01/17/2001 and 07/28/2005. Lumbar spine radiographs 01/31/2014. FINDINGS: There are lower lung volumes. Allowing for this, the heart size and mediastinal contours are stable with mild vascular tortuosity. There is diffuse central airway thickening without focal airspace disease or edema. The previously demonstrated right lung nodule is not well visualized. There is no pleural effusion or pneumothorax. Mild generalized osteosclerosis is suggested (not definite). IMPRESSION: 1. Low lung volumes with generalized central airway thickening. No focal airspace disease or edema. 2. Suggested mild generalized osteosclerosis. In this patient with apparent prostate cancer, this could reflect metastatic disease. Correlation with serum PSA recommended. Electronically Signed   By: Richardean Sale M.D.   On: 07/21/2015 15:47   Dg Abd  Portable 2v  07/22/2015  CLINICAL DATA:  Abdominal pain with weakness and diarrhea. EXAM: PORTABLE ABDOMEN - 2 VIEW COMPARISON:  Lumbar spine and hip radiographs 01/31/2014. FINDINGS: Gas is present throughout the small and large bowel. There are scattered air-fluid levels within the colon on the decubitus view. No significant bowel distention, wall thickening or free intraperitoneal air demonstrated. Severe degenerative changes are again noted at both hips. There are mild degenerative changes within the lumbar spine. Prostate brachytherapy seeds are noted. IMPRESSION: Nonobstructive bowel gas pattern. Scattered air-fluid levels within the colon, nonspecific in the setting of diarrhea. Severe osteoarthritis of both hips. Electronically Signed   By: Richardean Sale M.D.   On: 07/22/2015 09:47    Assessment/Plan  Insomnia No sleep for past two days. Will order Melatonin 3 mg Tablet at bedtime. Continue to monitor.  BPH Patient's daughter request Oxybutynin to be scheduled twice daily as ordered by urologist. Will change oxybutynin 5 mg Tablet to twice daily.   Left knee pain  Due to OA has Hydrocodone-APAP 5/325 mg Tablet every 4 HRS PRN but has requested pain med once today. Will order  Schedule Hydrocodone-APAP 5/325 mg Tablet one by mouth twice daily.  Edema  Bilateral LE's edema 2+ will order Bilat. Knee high Ted hose on in AM and off at bedtime.   Family/ staff Communication: Reviewed plan of care with patient, patient's daughter and Pharmacist, hospital.    Labs/tests ordered:  None

## 2015-07-30 ENCOUNTER — Other Ambulatory Visit: Payer: Self-pay

## 2015-07-30 MED ORDER — HYDROCODONE-ACETAMINOPHEN 5-325 MG PO TABS: ORAL_TABLET | ORAL | Status: DC

## 2015-07-30 NOTE — Telephone Encounter (Signed)
Rx faxed to [facility] @ [phone], phone number [phone]  

## 2015-08-05 ENCOUNTER — Non-Acute Institutional Stay (SKILLED_NURSING_FACILITY): Payer: Medicare Other | Admitting: Family

## 2015-08-05 DIAGNOSIS — M25562 Pain in left knee: Secondary | ICD-10-CM | POA: Diagnosis not present

## 2015-08-05 DIAGNOSIS — E785 Hyperlipidemia, unspecified: Secondary | ICD-10-CM | POA: Diagnosis not present

## 2015-08-05 DIAGNOSIS — G609 Hereditary and idiopathic neuropathy, unspecified: Secondary | ICD-10-CM

## 2015-08-05 DIAGNOSIS — G4700 Insomnia, unspecified: Secondary | ICD-10-CM

## 2015-08-05 DIAGNOSIS — I10 Essential (primary) hypertension: Secondary | ICD-10-CM | POA: Diagnosis not present

## 2015-08-05 DIAGNOSIS — R600 Localized edema: Secondary | ICD-10-CM

## 2015-08-05 DIAGNOSIS — E119 Type 2 diabetes mellitus without complications: Secondary | ICD-10-CM

## 2015-08-05 DIAGNOSIS — N400 Enlarged prostate without lower urinary tract symptoms: Secondary | ICD-10-CM

## 2015-08-05 DIAGNOSIS — R609 Edema, unspecified: Secondary | ICD-10-CM | POA: Insufficient documentation

## 2015-08-05 NOTE — Progress Notes (Signed)
Patient ID: Mark Valenzuela, male   DOB: 06/14/1922, 82 y.o.   MRN: [id number]  Location:  [place] of Service:  SNF (31)  Provider: Blanchie Serve, MD   PCP: [name], MD Patient Care Team: Seward Carol, MD as PCP - General (Internal Medicine)  Extended Emergency Contact Information Primary Emergency Contact: [facility] Address: [address]          [place], [place] [address] [place] Phone: [phone] Relation: Daughter Secondary Emergency Contact: Crumm,[name] Address: [address]          [place] [address] [place] Phone: [phone] Relation: Spouse  Code Status: Full code  Goals of care:  Advanced Directive information Advanced Directives 07/21/2015  Does patient have an advance directive? No  Would patient like information on creating an advanced directive? Yes - Spiritual care consult ordered     No Known Allergies  Chief Complaint  Patient presents with  . Discharge Note    HPI:  82 y.o. male seen today at [facility] for discharge home. He was here for short term rehabilitation post hospital admission from 07/21/15-07/23/15 with influenza A with respiratory manifestation. He was started on iv antibiotics and tamiflu and antibiotics were later discontinued. He also had diarrhea and stool c.diff was negative. He had acute renal failure and responded well to vi fluids. He also had hypoglycemia and his oral hypoglycemics were held. He is seen in his room today. He denies any acute issues today. He has worked well with physical therapy now stable for discharge home with PT/OT to continue with ROM, exercise, gait stability and muscle strengthening. He will require a [facility] to assist with ADL's. He will also need a semi-electric Hospital bed with half side rails to enable elevation of legs due to edema and to alleviate left knee pain. HH PT/OT and [place] [facility] will be arranged by  facility social worker prior to discharge.     Past Medical History  Diagnosis Date  . Diabetes mellitus without complication ([place])   . Hypertension   . Cancer (HCC) approx. 2002    prostate cancer  . Neuropathy [facility])     Past Surgical History  Procedure Laterality Date  . Seed implants  approx 2002    for prostate cancer      reports that he quit smoking about 1 months ago. His smoking use included Cigarettes. He has a 25 pack-year smoking history. He has never used smokeless tobacco. He reports that he does not drink alcohol or use illicit drugs. Social History   Social History  . Marital Status: Married    Spouse Name: N/A  . Number of Children: N/A  . Years of Education: N/A   Occupational History  . Not on file.   Social History Main Topics  . Smoking status: Former Smoker -- 0.50 packs/day for 50 years    Types: Cigarettes    Quit date: 06/06/2015  . Smokeless tobacco: Never Used  . Alcohol Use: No     Comment: 07/21/15 -quit approx 50 years today  . Drug Use: No  . Sexual Activity: Not on file   Other Topics Concern  . Not on file   Social History Narrative   Functional Status Survey:    No Known Allergies  Pertinent  Health Maintenance Due  Topic Date Due  . HEMOGLOBIN A1C  June 07, 1922  . FOOT EXAM  05/25/1934  . OPHTHALMOLOGY EXAM  05/25/1934  . URINE MICROALBUMIN  05/25/1934  . PNA vac Low Risk Adult (1 of 2 - PCV13) 05/25/1989  . INFLUENZA VACCINE  12/16/2014    Medications:   Medication List       This list is accurate as of: 08/05/15  5:02 PM.  Always use your most recent med list.               albuterol (2.5 MG/3ML) 0.083% nebulizer solution  Commonly known as:  PROVENTIL  Take 3 mLs (2.5 mg total) by nebulization every 4 (four) hours as needed for wheezing (stop after flu resolves).     aspirin EC 81 MG tablet  Take 81 mg by mouth daily.     atorvastatin 10 MG tablet  Commonly known as:  LIPITOR  Take 10 mg by mouth daily.       cloNIDine 0.2 MG tablet  Commonly known as:  CATAPRES  Take 0.2 mg by mouth 2 (two) times daily.     gabapentin 300 MG capsule  Commonly known as:  NEURONTIN  Take 300 mg by mouth 3 (three) times daily.     HYDROcodone-acetaminophen 5-325 MG tablet  Commonly known as:  NORCO/VICODIN  1 by mouth twice daily for pain DO NOT EXCEED 4GM OF APAP IN 24 HOURS FROM ALL SOURCES     oxybutynin 5 MG tablet  Commonly known as:  DITROPAN  Take 10 mg by mouth at bedtime.     rOPINIRole 0.25 MG tablet  Commonly known as:  REQUIP  Take 0.25 mg by mouth at bedtime.     simethicone 40 MG/0.6ML drops  Commonly known as:  MYLICON  Take 1.2 mLs (80 mg total) by mouth 4 (four) times daily as needed for flatulence.     tamsulosin 0.4 MG Caps capsule  Commonly known as:  FLOMAX  Take 0.4 mg by mouth daily.     TYLENOL 500 MG tablet  Generic drug:  acetaminophen  Take 500 mg by mouth every 6 (six) hours as needed for mild pain.        Review of Systems  Constitutional: Negative.   HENT: Negative.   Eyes: Negative.   Respiratory: Negative.   Cardiovascular: Positive for leg swelling.  Gastrointestinal: Negative.   Endocrine: Negative.   Genitourinary: Negative.   Musculoskeletal: Positive for gait problem.       Left knee pain   Skin: Negative.   Allergic/Immunologic: Negative.   Neurological: Negative.   Hematological: Negative.   Psychiatric/Behavioral: Negative.     Filed Vitals:   08/05/15 1634  BP: 141/79  Pulse: 76  Temp: 97.6 F (36.4 C)  Resp: 20  Weight: 170 lb 12.8 oz (77.474 kg)  SpO2: 96%   Body mass index is 25.98 kg/(m^2). Physical Exam  Constitutional: He appears well-developed and well-nourished. No distress.  HENT:  Head: Normocephalic.  Mouth/Throat: Oropharynx is clear and moist.  Eyes: Conjunctivae and EOM are normal. Pupils are equal, round, and reactive to light. Right eye exhibits no discharge. Left eye exhibits no discharge. No scleral icterus.   Neck: Normal range of motion. No JVD present.  Cardiovascular: Normal rate, regular rhythm, normal heart sounds and intact distal pulses.  Exam reveals no gallop and no friction rub.   No murmur heard. Pulmonary/Chest: Effort normal and breath sounds normal. No respiratory distress. He has no wheezes. He has no rales.  Abdominal: Soft. Bowel sounds are normal. He exhibits no distension and no mass.  There is no tenderness. There is no rebound and no guarding.  Musculoskeletal: He exhibits no tenderness.  Normal ROM except left knee limited due to pain. Bilateral LE's + 2 edema.   Lymphadenopathy:    He has no cervical adenopathy.  Neurological: He is alert.  Skin: Skin is warm and dry. No rash noted. No erythema. No pallor.  Psychiatric: He has a normal mood and affect.    Labs reviewed: Basic Metabolic Panel:  Recent Labs  07/21/15 1431 07/22/15 07/22/15 0453  NA 137 142 142  K 3.1*  --  3.8  CL 102  --  113*  CO2 23  --  22  GLUCOSE 89  --  87  BUN 18 15 15   CREATININE 1.58* 1.2 1.23  CALCIUM 8.6*  --  7.9*  MG  --   --  1.9  PHOS  --   --  2.7   Liver Function Tests:  Recent Labs  07/21/15 1431 07/22/15 0453  AST 82* 80*  ALT 32 27  ALKPHOS 35* 29*  BILITOT 0.9 1.0  PROT 7.5 6.5  ALBUMIN 3.9 3.1*   No results for input(s): LIPASE, AMYLASE in the last 8760 hours. No results for input(s): AMMONIA in the last 8760 hours. CBC:  Recent Labs  07/21/15 07/21/15 1431  WBC 5.5 5.5  NEUTROABS  --  4.4  HGB  --  13.4  HCT  --  40.6  MCV  --  86.2  PLT  --  PLATELET CLUMPS NOTED ON SMEAR, UNABLE TO ESTIMATE   Cardiac Enzymes: No results for input(s): CKTOTAL, CKMB, CKMBINDEX, TROPONINI in the last 8760 hours. BNP: Invalid input(s): POCBNP CBG:  Recent Labs  07/23/15 0727 07/23/15 1133 07/23/15 1806  GLUCAP 108* 147* 161*    Procedures and Imaging Studies During Stay: Dg Chest 2 View  07/21/2015  CLINICAL DATA:  Generalized weakness with diarrhea and  shortness of breath. EXAM: CHEST  2 VIEW COMPARISON:  Chest radiographs 01/17/2001 and 07/28/2005. Lumbar spine radiographs 01/31/2014. FINDINGS: There are lower lung volumes. Allowing for this, the heart size and mediastinal contours are stable with mild vascular tortuosity. There is diffuse central airway thickening without focal airspace disease or edema. The previously demonstrated right lung nodule is not well visualized. There is no pleural effusion or pneumothorax. Mild generalized osteosclerosis is suggested (not definite). IMPRESSION: 1. Low lung volumes with generalized central airway thickening. No focal airspace disease or edema. 2. Suggested mild generalized osteosclerosis. In this patient with apparent prostate cancer, this could reflect metastatic disease. Correlation with serum PSA recommended. Electronically Signed   By: Richardean Sale M.D.   On: 07/21/2015 15:47   Dg Abd Portable 2v  07/22/2015  CLINICAL DATA:  Abdominal pain with weakness and diarrhea. EXAM: PORTABLE ABDOMEN - 2 VIEW COMPARISON:  Lumbar spine and hip radiographs 01/31/2014. FINDINGS: Gas is present throughout the small and large bowel. There are scattered air-fluid levels within the colon on the decubitus view. No significant bowel distention, wall thickening or free intraperitoneal air demonstrated. Severe degenerative changes are again noted at both hips. There are mild degenerative changes within the lumbar spine. Prostate brachytherapy seeds are noted. IMPRESSION: Nonobstructive bowel gas pattern. Scattered air-fluid levels within the colon, nonspecific in the setting of diarrhea. Severe osteoarthritis of both hips. Electronically Signed   By: Richardean Sale M.D.   On: 07/22/2015 09:47    Assessment/Plan:   1. Essential hypertension B/P stable.Continue on clonidine 0.2 mg Tablet. PCP to monitor BMP  2. BPH (benign prostatic hyperplasia) Has Hx of prostate CA.Continue on Tamsulosin, Oxybutynin. Continue to monitor.     3. Left knee pain Pain under controlled with current regimen. He will require a semi-electric hospital bed with half side bed rails to assist in getting in and out of bed and to elevate legs to alleviate pain.   4. Insomnia Continue on Melatonin at bedtime.   5. Localized edema Continue with bilateral Ted hose knee high on in the the morning and off at bedtime. Elevate legs in bed. PCP to monitor BMP.   6. Neuropathy Secondary to type 2 DM and HTN.Continue on Gabapentin 300 mg Capsule. Continue to control high risk factors.  7. Hyperlipidemia Continue on Atorvastatin 10 mg Tablet. PCP to monitor lipid panel 8.Type 2 DM CBG's stable averages 80's -180's. Testing CBG's once daily. Recent Hgb A1C 7.8 ( 07/28/2015) current no med to prevent hypoglycemia given advance age. PCP to monitor.     Patient is being discharged with the following home health services:    PT/OT to continue with ROM, exercise, gait stability and muscle strengthening.   [facility] to assist with ADL's.    Patient is being discharged with the following durable medical equipment:   semi-electric Hospital bed with half side rails to enable elevation of legs due to edema and to alleviate left knee pain.   Rx written X 1 month supply.   Patient has been advised to f/u with their PCP in 1-2 weeks to bring them up to date on their rehab stay.  Social services at facility was responsible for arranging this appointment.  Pt was provided with a 30 day supply of prescriptions for medications and refills must be obtained from their PCP.  For controlled substances, a more limited supply may be provided adequate until PCP appointment only.  Future labs/tests needed: CBC, BMP with PCP

## 2015-08-10 DIAGNOSIS — R197 Diarrhea, unspecified: Secondary | ICD-10-CM | POA: Diagnosis not present

## 2015-08-10 DIAGNOSIS — R531 Weakness: Secondary | ICD-10-CM | POA: Diagnosis not present

## 2015-08-10 DIAGNOSIS — L989 Disorder of the skin and subcutaneous tissue, unspecified: Secondary | ICD-10-CM | POA: Diagnosis not present

## 2015-08-10 DIAGNOSIS — E1140 Type 2 diabetes mellitus with diabetic neuropathy, unspecified: Secondary | ICD-10-CM | POA: Diagnosis not present

## 2015-08-10 DIAGNOSIS — I10 Essential (primary) hypertension: Secondary | ICD-10-CM | POA: Diagnosis not present

## 2015-08-10 DIAGNOSIS — Z87891 Personal history of nicotine dependence: Secondary | ICD-10-CM | POA: Diagnosis not present

## 2015-08-13 ENCOUNTER — Emergency Department (HOSPITAL_COMMUNITY): Payer: Medicare Other

## 2015-08-13 ENCOUNTER — Inpatient Hospital Stay (HOSPITAL_COMMUNITY)
Admission: EM | Admit: 2015-08-13 | Discharge: 2015-08-19 | DRG: 372 | Disposition: A | Discharge status: Home / Self Care | Payer: Medicare Other | Attending: Internal Medicine | Admitting: Internal Medicine

## 2015-08-13 ENCOUNTER — Encounter (HOSPITAL_COMMUNITY): Payer: Self-pay | Admitting: Neurology

## 2015-08-13 DIAGNOSIS — R509 Fever, unspecified: Secondary | ICD-10-CM | POA: Diagnosis not present

## 2015-08-13 DIAGNOSIS — L8990 Pressure ulcer of unspecified site, unspecified stage: Secondary | ICD-10-CM | POA: Insufficient documentation

## 2015-08-13 DIAGNOSIS — Z8546 Personal history of malignant neoplasm of prostate: Secondary | ICD-10-CM

## 2015-08-13 DIAGNOSIS — E119 Type 2 diabetes mellitus without complications: Secondary | ICD-10-CM | POA: Diagnosis not present

## 2015-08-13 DIAGNOSIS — R5381 Other malaise: Secondary | ICD-10-CM | POA: Insufficient documentation

## 2015-08-13 DIAGNOSIS — E876 Hypokalemia: Secondary | ICD-10-CM | POA: Diagnosis present

## 2015-08-13 DIAGNOSIS — A047 Enterocolitis due to Clostridium difficile: Principal | ICD-10-CM | POA: Diagnosis present

## 2015-08-13 DIAGNOSIS — E785 Hyperlipidemia, unspecified: Secondary | ICD-10-CM | POA: Diagnosis not present

## 2015-08-13 DIAGNOSIS — R197 Diarrhea, unspecified: Secondary | ICD-10-CM | POA: Diagnosis not present

## 2015-08-13 DIAGNOSIS — Z79899 Other long term (current) drug therapy: Secondary | ICD-10-CM

## 2015-08-13 DIAGNOSIS — Z7982 Long term (current) use of aspirin: Secondary | ICD-10-CM

## 2015-08-13 DIAGNOSIS — E1140 Type 2 diabetes mellitus with diabetic neuropathy, unspecified: Secondary | ICD-10-CM | POA: Diagnosis not present

## 2015-08-13 DIAGNOSIS — N390 Urinary tract infection, site not specified: Secondary | ICD-10-CM | POA: Diagnosis present

## 2015-08-13 DIAGNOSIS — I10 Essential (primary) hypertension: Secondary | ICD-10-CM | POA: Diagnosis present

## 2015-08-13 DIAGNOSIS — R531 Weakness: Secondary | ICD-10-CM | POA: Diagnosis not present

## 2015-08-13 DIAGNOSIS — M160 Bilateral primary osteoarthritis of hip: Secondary | ICD-10-CM | POA: Diagnosis present

## 2015-08-13 DIAGNOSIS — D696 Thrombocytopenia, unspecified: Secondary | ICD-10-CM | POA: Diagnosis present

## 2015-08-13 DIAGNOSIS — N400 Enlarged prostate without lower urinary tract symptoms: Secondary | ICD-10-CM | POA: Diagnosis not present

## 2015-08-13 DIAGNOSIS — Z87891 Personal history of nicotine dependence: Secondary | ICD-10-CM

## 2015-08-13 DIAGNOSIS — R300 Dysuria: Secondary | ICD-10-CM | POA: Diagnosis not present

## 2015-08-13 DIAGNOSIS — K591 Functional diarrhea: Secondary | ICD-10-CM | POA: Diagnosis not present

## 2015-08-13 DIAGNOSIS — A09 Infectious gastroenteritis and colitis, unspecified: Secondary | ICD-10-CM | POA: Diagnosis not present

## 2015-08-13 LAB — URINALYSIS, ROUTINE W REFLEX MICROSCOPIC
Bilirubin Urine: NEGATIVE
GLUCOSE, UA: NEGATIVE mg/dL
KETONES UR: 15 mg/dL — AB
Nitrite: POSITIVE — AB
PROTEIN: 30 mg/dL — AB
Specific Gravity, Urine: 1.008 (ref 1.005–1.030)
pH: 8.5 — ABNORMAL HIGH (ref 5.0–8.0)

## 2015-08-13 LAB — CBG MONITORING, ED: GLUCOSE-CAPILLARY: 84 mg/dL (ref 65–99)

## 2015-08-13 LAB — BASIC METABOLIC PANEL
Anion gap: 11 (ref 5–15)
BUN: 10 mg/dL (ref 6–20)
CALCIUM: 9 mg/dL (ref 8.9–10.3)
CO2: 22 mmol/L (ref 22–32)
CREATININE: 1.2 mg/dL (ref 0.61–1.24)
Chloride: 105 mmol/L (ref 101–111)
GFR calc Af Amer: 59 mL/min — ABNORMAL LOW (ref >60)
GFR, EST NON AFRICAN AMERICAN: 51 mL/min — AB (ref >60)
GLUCOSE: 113 mg/dL — AB (ref 65–99)
Potassium: 4.3 mmol/L (ref 3.5–5.1)
Sodium: 138 mmol/L (ref 135–145)

## 2015-08-13 LAB — URINE MICROSCOPIC-ADD ON

## 2015-08-13 LAB — CBC
HCT: 37.2 % — ABNORMAL LOW (ref 39.0–52.0)
Hemoglobin: 12.8 g/dL — ABNORMAL LOW (ref 13.0–17.0)
MCH: 28.4 pg (ref 26.0–34.0)
MCHC: 34.4 g/dL (ref 30.0–36.0)
MCV: 82.5 fL (ref 78.0–100.0)
PLATELETS: 193 10*3/uL (ref 150–400)
RBC: 4.51 MIL/uL (ref 4.22–5.81)
RDW: 14.5 % (ref 11.5–15.5)
WBC: 5.4 10*3/uL (ref 4.0–10.5)

## 2015-08-13 LAB — I-STAT CG4 LACTIC ACID, ED: Lactic Acid, Venous: 1.75 mmol/L (ref 0.5–2.0)

## 2015-08-13 LAB — GLUCOSE, CAPILLARY: GLUCOSE-CAPILLARY: 118 mg/dL — AB (ref 65–99)

## 2015-08-13 MED ORDER — LOPERAMIDE HCL 2 MG PO CAPS: 2.0000 mg | ORAL_CAPSULE | ORAL | Status: DC | PRN

## 2015-08-13 MED ORDER — TAMSULOSIN HCL 0.4 MG PO CAPS
0.4000 mg | ORAL_CAPSULE | Freq: Every day | ORAL | Status: DC
  Administered 2015-08-13 – 2015-08-19 (×7): 0.4 mg via ORAL
  Filled 2015-08-13 (×7): qty 1

## 2015-08-13 MED ORDER — DEXTROSE 5 % IV SOLN
1.0000 g | Freq: Once | INTRAVENOUS | Status: AC
  Administered 2015-08-13: 1 g via INTRAVENOUS
  Filled 2015-08-13: qty 10

## 2015-08-13 MED ORDER — HYDROCODONE-ACETAMINOPHEN 5-325 MG PO TABS
1.0000 | ORAL_TABLET | ORAL | Status: DC | PRN
  Administered 2015-08-19: 1 via ORAL

## 2015-08-13 MED ORDER — ACETAMINOPHEN 500 MG PO TABS
1000.0000 mg | ORAL_TABLET | Freq: Once | ORAL | Status: AC
  Administered 2015-08-13: 1000 mg via ORAL
  Filled 2015-08-13: qty 2

## 2015-08-13 MED ORDER — ENOXAPARIN SODIUM 40 MG/0.4ML ~~LOC~~ SOLN
40.0000 mg | SUBCUTANEOUS | Status: DC
  Administered 2015-08-13 – 2015-08-18 (×6): 40 mg via SUBCUTANEOUS
  Filled 2015-08-13 (×6): qty 0.4

## 2015-08-13 MED ORDER — ATORVASTATIN CALCIUM 10 MG PO TABS
10.0000 mg | ORAL_TABLET | Freq: Every day | ORAL | Status: DC
  Administered 2015-08-13 – 2015-08-19 (×7): 10 mg via ORAL
  Filled 2015-08-13 (×7): qty 1

## 2015-08-13 MED ORDER — ASPIRIN EC 81 MG PO TBEC
81.0000 mg | DELAYED_RELEASE_TABLET | Freq: Every day | ORAL | Status: DC
  Administered 2015-08-13 – 2015-08-19 (×7): 81 mg via ORAL
  Filled 2015-08-13 (×7): qty 1

## 2015-08-13 MED ORDER — ONDANSETRON HCL 4 MG PO TABS: 4.0000 mg | ORAL_TABLET | Freq: Four times a day (QID) | ORAL | Status: DC | PRN

## 2015-08-13 MED ORDER — TRAZODONE HCL 50 MG PO TABS: 25.0000 mg | ORAL_TABLET | Freq: Every evening | ORAL | Status: DC | PRN

## 2015-08-13 MED ORDER — MORPHINE SULFATE (PF) 2 MG/ML IV SOLN: 1.0000 mg | INTRAVENOUS | Status: DC | PRN

## 2015-08-13 MED ORDER — HYDROCODONE-ACETAMINOPHEN 5-325 MG PO TABS: 1.0000 | ORAL_TABLET | Freq: Four times a day (QID) | ORAL | Status: DC | PRN

## 2015-08-13 MED ORDER — ACETAMINOPHEN 500 MG PO TABS: 500.0000 mg | ORAL_TABLET | Freq: Four times a day (QID) | ORAL | Status: DC | PRN

## 2015-08-13 MED ORDER — GABAPENTIN 300 MG PO CAPS
300.0000 mg | ORAL_CAPSULE | Freq: Two times a day (BID) | ORAL | Status: DC
  Administered 2015-08-13 – 2015-08-19 (×12): 300 mg via ORAL
  Filled 2015-08-13 (×12): qty 1

## 2015-08-13 MED ORDER — OXYBUTYNIN CHLORIDE 5 MG PO TABS
10.0000 mg | ORAL_TABLET | Freq: Every day | ORAL | Status: DC
  Administered 2015-08-13 – 2015-08-18 (×6): 10 mg via ORAL
  Filled 2015-08-13 (×7): qty 2

## 2015-08-13 MED ORDER — PHENAZOPYRIDINE HCL 100 MG PO TABS
100.0000 mg | ORAL_TABLET | Freq: Three times a day (TID) | ORAL | Status: DC
  Administered 2015-08-13 – 2015-08-14 (×2): 100 mg via ORAL
  Filled 2015-08-13 (×2): qty 1

## 2015-08-13 MED ORDER — SODIUM CHLORIDE 0.9 % IV BOLUS (SEPSIS)
1000.0000 mL | Freq: Once | INTRAVENOUS | Status: AC
  Administered 2015-08-13: 1000 mL via INTRAVENOUS

## 2015-08-13 MED ORDER — ROPINIROLE HCL 0.25 MG PO TABS
0.2500 mg | ORAL_TABLET | Freq: Every day | ORAL | Status: DC
  Administered 2015-08-13 – 2015-08-18 (×6): 0.25 mg via ORAL
  Filled 2015-08-13 (×7): qty 1

## 2015-08-13 MED ORDER — ONDANSETRON HCL 4 MG/2ML IJ SOLN
4.0000 mg | Freq: Four times a day (QID) | INTRAMUSCULAR | Status: DC | PRN
Start: 2015-08-13 — End: 2015-08-19

## 2015-08-13 MED ORDER — DEXTROSE 5 % IV SOLN
1.0000 g | INTRAVENOUS | Status: DC
  Filled 2015-08-13: qty 10

## 2015-08-13 MED ORDER — SODIUM CHLORIDE 0.9 % IV SOLN
INTRAVENOUS | Status: DC
  Administered 2015-08-13 (×2): via INTRAVENOUS

## 2015-08-13 MED ORDER — INSULIN ASPART 100 UNIT/ML ~~LOC~~ SOLN
0.0000 [IU] | Freq: Three times a day (TID) | SUBCUTANEOUS | Status: DC
  Administered 2015-08-14: 3 [IU] via SUBCUTANEOUS
  Administered 2015-08-14: 1 [IU] via SUBCUTANEOUS
  Administered 2015-08-15 (×2): 2 [IU] via SUBCUTANEOUS
  Administered 2015-08-15 – 2015-08-16 (×2): 1 [IU] via SUBCUTANEOUS
  Administered 2015-08-16 (×2): 2 [IU] via SUBCUTANEOUS
  Administered 2015-08-17 – 2015-08-18 (×3): 1 [IU] via SUBCUTANEOUS
  Administered 2015-08-18: 5 [IU] via SUBCUTANEOUS
  Administered 2015-08-18: 2 [IU] via SUBCUTANEOUS
  Administered 2015-08-19: 1 [IU] via SUBCUTANEOUS
  Administered 2015-08-19: 2 [IU] via SUBCUTANEOUS

## 2015-08-13 MED ORDER — CLONIDINE HCL 0.2 MG PO TABS
0.2000 mg | ORAL_TABLET | Freq: Two times a day (BID) | ORAL | Status: DC
  Administered 2015-08-13 – 2015-08-19 (×12): 0.2 mg via ORAL
  Filled 2015-08-13 (×12): qty 1

## 2015-08-13 NOTE — ED Provider Notes (Signed)
CSN: [id number]     Arrival date & time 08/13/15  1247 History   First MD Initiated Contact with Patient 08/13/15 1300     Chief Complaint  Patient presents with  . Weakness     (Consider location/radiation/quality/duration/timing/severity/associated sxs/prior Treatment) HPI Comments: Patient presents to the emergency department with chief complaint of persistent diarrhea and weakness. Admitted to the hospital for influenza, was then discharged to a rehabilitation center. His company by family, who state that the patient was discharged from the rehabilitation center on Saturday, but since then he has had persistent diarrhea, weakness, and now new dysuria. Patient was seen by his primary care doctor today, and was advised to come to the emergency department for reevaluation. He denies any other symptoms. There are no modifying factors.  The history is provided by the patient. No language interpreter was used.    Past Medical History  Diagnosis Date  . Diabetes mellitus without complication ([place])   . Hypertension   . Cancer (HCC) approx. 2002    prostate cancer  . Neuropathy [facility])    Past Surgical History  Procedure Laterality Date  . Seed implants  approx 2002    for prostate cancer   No family history on file. Social History  Substance Use Topics  . Smoking status: Former Smoker -- 0.50 packs/day for 50 years    Types: Cigarettes    Quit date: 06/06/2015  . Smokeless tobacco: Never Used  . Alcohol Use: No     Comment: 07/21/15 -quit approx 50 years today    Review of Systems  Constitutional: Negative for fever and chills.  Respiratory: Positive for cough. Negative for shortness of breath.   Cardiovascular: Negative for chest pain.  Gastrointestinal: Positive for diarrhea. Negative for nausea, vomiting and constipation.  Genitourinary: Positive for dysuria.  All other systems reviewed and are negative.     Allergies  Review of patient's allergies indicates no known  allergies.  Home Medications   Prior to Admission medications   Medication Sig Start Date End Date Taking? Authorizing Provider  acetaminophen (TYLENOL) 500 MG tablet Take 500 mg by mouth every 6 (six) hours as needed for mild pain.    Historical Provider, MD  albuterol (PROVENTIL) (2.5 MG/3ML) 0.083% nebulizer solution Take 3 mLs (2.5 mg total) by nebulization every 4 (four) hours as needed for wheezing (stop after flu resolves). 07/23/15   Debbe Odea, MD  aspirin EC 81 MG tablet Take 81 mg by mouth daily.    Historical Provider, MD  atorvastatin (LIPITOR) 10 MG tablet Take 10 mg by mouth daily.    Historical Provider, MD  cloNIDine (CATAPRES) 0.2 MG tablet Take 0.2 mg by mouth 2 (two) times daily.    Historical Provider, MD  gabapentin (NEURONTIN) 300 MG capsule Take 300 mg by mouth 3 (three) times daily.    Historical Provider, MD  HYDROcodone-acetaminophen (NORCO/VICODIN) 5-325 MG tablet 1 by mouth twice daily for pain DO NOT EXCEED 4GM OF APAP IN 24 HOURS FROM ALL SOURCES Patient taking differently: 1 tablet every 6 (six) hours as needed. 1 by mouth twice daily for pain DO NOT EXCEED 4GM OF APAP IN 24 HOURS FROM ALL SOURCES 07/30/15   Estill Dooms, MD  oxybutynin (DITROPAN) 5 MG tablet Take 10 mg by mouth at bedtime.     Historical Provider, MD  rOPINIRole (REQUIP) 0.25 MG tablet Take 0.25 mg by mouth at bedtime.    Historical Provider, MD  simethicone (MYLICON) 40 [id number] drops Take 1.2  mLs (80 mg total) by mouth 4 (four) times daily as needed for flatulence. 07/23/15   Debbe Odea, MD  tamsulosin (FLOMAX) 0.4 MG CAPS capsule Take 0.4 mg by mouth daily.    Historical Provider, MD   BP 185/85 mmHg  Pulse 71  Temp(Src) 100.2 F (37.9 C) (Oral)  Resp 20  SpO2 98% Physical Exam  Constitutional: He is oriented to person, place, and time. He appears well-developed and well-nourished.  HENT:  Head: Normocephalic and atraumatic.  Eyes: Conjunctivae and EOM are normal. Pupils are equal,  round, and reactive to light. Right eye exhibits no discharge. Left eye exhibits no discharge. No scleral icterus.  Neck: Normal range of motion. Neck supple. No JVD present.  Cardiovascular: Normal rate, regular rhythm and normal heart sounds.  Exam reveals no gallop and no friction rub.   No murmur heard. Pulmonary/Chest: Effort normal and breath sounds normal. No respiratory distress. He has no wheezes. He has no rales. He exhibits no tenderness.  Abdominal: Soft. He exhibits no distension and no mass. There is no tenderness. There is no rebound and no guarding.  Musculoskeletal: Normal range of motion. He exhibits no edema or tenderness.  Neurological: He is alert and oriented to person, place, and time.  Skin: Skin is warm and dry.  Psychiatric: He has a normal mood and affect. His behavior is normal. Judgment and thought content normal.  Nursing note and vitals reviewed.   ED Course  Procedures (including critical care time) Results for orders placed or performed during the hospital encounter of [id number]  Basic metabolic panel  Result Value Ref Range   Sodium 138 135 - 145 mmol/L   Potassium 4.3 3.5 - 5.1 mmol/L   Chloride 105 101 - 111 mmol/L   CO2 22 22 - 32 mmol/L   Glucose, Bld 113 (H) 65 - 99 mg/dL   BUN 10 6 - 20 mg/dL   Creatinine, Ser 1.20 0.61 - 1.24 mg/dL   Calcium 9.0 8.9 - 10.3 mg/dL   GFR calc non Af Amer 51 (L) >60 mL/min   GFR calc Af Amer 59 (L) >60 mL/min   Anion gap 11 5 - 15  CBC  Result Value Ref Range   WBC 5.4 4.0 - 10.5 K/uL   RBC 4.51 4.22 - 5.81 MIL/uL   Hemoglobin 12.8 (L) 13.0 - 17.0 g/dL   HCT 37.2 (L) 39.0 - 52.0 %   MCV 82.5 78.0 - 100.0 fL   MCH 28.4 26.0 - 34.0 pg   MCHC 34.4 30.0 - 36.0 g/dL   RDW 14.5 11.5 - 15.5 %   Platelets 193 150 - 400 K/uL  Urinalysis, Routine w reflex microscopic (not at [facility])  Result Value Ref Range   Color, Urine YELLOW YELLOW   APPearance TURBID (A) CLEAR   Specific Gravity, Urine 1.008 1.005 - 1.030   pH  8.5 (H) 5.0 - 8.0   Glucose, UA NEGATIVE NEGATIVE mg/dL   Hgb urine dipstick SMALL (A) NEGATIVE   Bilirubin Urine NEGATIVE NEGATIVE   Ketones, ur 15 (A) NEGATIVE mg/dL   Protein, ur 30 (A) NEGATIVE mg/dL   Nitrite POSITIVE (A) NEGATIVE   Leukocytes, UA LARGE (A) NEGATIVE  Urine microscopic-add on  Result Value Ref Range   Squamous Epithelial / LPF 0-5 (A) NONE SEEN   WBC, UA TOO NUMEROUS TO COUNT 0 - 5 WBC/hpf   RBC / HPF 0-5 0 - 5 RBC/hpf   Bacteria, UA MANY (A) NONE SEEN  I-Stat CG4  Lactic Acid, ED  Result Value Ref Range   Lactic Acid, Venous 1.75 0.5 - 2.0 mmol/L   Dg Chest 2 View  07/21/2015  CLINICAL DATA:  Generalized weakness with diarrhea and shortness of breath. EXAM: CHEST  2 VIEW COMPARISON:  Chest radiographs 01/17/2001 and 07/28/2005. Lumbar spine radiographs 01/31/2014. FINDINGS: There are lower lung volumes. Allowing for this, the heart size and mediastinal contours are stable with mild vascular tortuosity. There is diffuse central airway thickening without focal airspace disease or edema. The previously demonstrated right lung nodule is not well visualized. There is no pleural effusion or pneumothorax. Mild generalized osteosclerosis is suggested (not definite). IMPRESSION: 1. Low lung volumes with generalized central airway thickening. No focal airspace disease or edema. 2. Suggested mild generalized osteosclerosis. In this patient with apparent prostate cancer, this could reflect metastatic disease. Correlation with serum PSA recommended. Electronically Signed   By: Richardean Sale M.D.   On: 07/21/2015 15:47   Dg Chest Port 1 View  08/13/2015  CLINICAL DATA:  Fever and dehydration. History of prostate carcinoma EXAM: PORTABLE CHEST 1 VIEW COMPARISON:  July 31, 2015 FINDINGS: There is no edema or consolidation. Heart is upper normal in size with pulmonary vascularity within normal limits. No adenopathy. No bone lesions. IMPRESSION: No edema or consolidation. Electronically  Signed   By: Lowella Grip III M.D.   On: 08/13/2015 13:33   Dg Abd Portable 2v  07/22/2015  CLINICAL DATA:  Abdominal pain with weakness and diarrhea. EXAM: PORTABLE ABDOMEN - 2 VIEW COMPARISON:  Lumbar spine and hip radiographs 01/31/2014. FINDINGS: Gas is present throughout the small and large bowel. There are scattered air-fluid levels within the colon on the decubitus view. No significant bowel distention, wall thickening or free intraperitoneal air demonstrated. Severe degenerative changes are again noted at both hips. There are mild degenerative changes within the lumbar spine. Prostate brachytherapy seeds are noted. IMPRESSION: Nonobstructive bowel gas pattern. Scattered air-fluid levels within the colon, nonspecific in the setting of diarrhea. Severe osteoarthritis of both hips. Electronically Signed   By: Richardean Sale M.D.   On: 07/22/2015 09:47    I have personally reviewed and evaluated these images and lab results as part of my medical decision-making.   EKG Interpretation None      MDM   Final diagnoses:  UTI (lower urinary tract infection)  Diarrhea, unspecified type    Patient with generalized weakness, and diarrhea. Recently admitted for influenza. Sent to a skilled nursing facility for rehabilitation, and was ultimately discharged on Saturday. Patient has had persistent symptoms. He is also now complaining of dysuria. Will check UA and labs. Patient will most likely need to be readmitted to the hospital.  Urinalysis is consistent with UTI. Patient mildly febrile to 100.2. Remaining vital signs are stable. Patient given Rocephin in the ED.  Patient discussed with Dr. Jeneen Rinks, who agrees with plan for admission.  Appreciate  [facility] for admitting the patient.    Montine Circle, PA-C 08/13/15 1558  Tanna Furry, MD 08/16/15 1020

## 2015-08-13 NOTE — ED Notes (Signed)
Pt coming from PCP for diarrhea after being discharged from nursing home on Saturday. Was admitting to hospital prior for influenza, fever, RF, HTN. Sent here for evaluation with concern for dehydration, RF. Pt alert to baseline. BP 180/88,.

## 2015-08-13 NOTE — H&P (Signed)
[facility] History and Physical  CAMBRIDGE DELEO [id number] DOB: October 30, 1922 DOA: 08/13/2015  Referring physician:  PCP: [name], MD   Chief Complaint: Diarrhea, Dysuria  HPI: Mark Valenzuela is a 82 y.o. male  With a history of  hypertension, prostate cancer, diabetic neuropathy, recently hospitalized from 3/6 to 3/8 for Flu  discharged to [place], now presenting to the ED as instructed by his PCP, with diarrhea since last Saturday. He reports worst symptoms present on Sunday, but not improved since. He reports his stools to be liquid,no blood was visible. He reports generalized weakness, fatigue, poor oral intake, subjective fevers and chills. He denies shortness of breath but does report light green cough. He denies any chest pain or palpitations. He also reports dysuria, without gross hematuria.  At the ED, he had low grad fever at 100.2, other vital signs were stable. CBC was normal. C met was unremarkable.Lactic acid is 1.75.UA was positive for nitrite.chest x-ray shows no edema or consolidation. He was given 1 L bolus of IV fluids, and initiation of antibiotics IV with processing for UTI after urine cultures were obtained. He will be admitted for observation and management of his symptoms.  Review of Systems:  See HPI for significant positives All other systems were reviewed and are negative.  Past Medical History  Diagnosis Date  . Diabetes mellitus without complication ([place])   . Hypertension   . Cancer (HCC) approx. 2002    prostate cancer  . Neuropathy [facility])    Past Surgical History  Procedure Laterality Date  . Seed implants  approx 2002    for prostate cancer   Social History:  reports that he quit smoking about 2 months ago. His smoking use included Cigarettes. He has a 25 pack-year smoking history. He has never used smokeless tobacco. He reports that he does not drink alcohol or use illicit drugs.  No Known Allergies  No family history on file.    Prior to Admission medications   Medication Sig Start Date End Date Taking? Authorizing Provider  acetaminophen (TYLENOL) 500 MG tablet Take 500 mg by mouth every 6 (six) hours as needed for mild pain.    Historical Provider, MD  albuterol (PROVENTIL) (2.5 MG/3ML) 0.083% nebulizer solution Take 3 mLs (2.5 mg total) by nebulization every 4 (four) hours as needed for wheezing (stop after flu resolves). 07/23/15   Debbe Odea, MD  aspirin EC 81 MG tablet Take 81 mg by mouth daily.    Historical Provider, MD  atorvastatin (LIPITOR) 10 MG tablet Take 10 mg by mouth daily.    Historical Provider, MD  cloNIDine (CATAPRES) 0.2 MG tablet Take 0.2 mg by mouth 2 (two) times daily.    Historical Provider, MD  gabapentin (NEURONTIN) 300 MG capsule Take 300 mg by mouth 3 (three) times daily.    Historical Provider, MD  HYDROcodone-acetaminophen (NORCO/VICODIN) 5-325 MG tablet 1 by mouth twice daily for pain DO NOT EXCEED 4GM OF APAP IN 24 HOURS FROM ALL SOURCES Patient taking differently: 1 tablet every 6 (six) hours as needed. 1 by mouth twice daily for pain DO NOT EXCEED 4GM OF APAP IN 24 HOURS FROM ALL SOURCES 07/30/15   Estill Dooms, MD  oxybutynin (DITROPAN) 5 MG tablet Take 10 mg by mouth at bedtime.     Historical Provider, MD  rOPINIRole (REQUIP) 0.25 MG tablet Take 0.25 mg by mouth at bedtime.    Historical Provider, MD  simethicone (MYLICON) 40 NT/6.1WE drops Take 1.2 mLs (  80 mg total) by mouth 4 (four) times daily as needed for flatulence. 07/23/15   Debbe Odea, MD  tamsulosin (FLOMAX) 0.4 MG CAPS capsule Take 0.4 mg by mouth daily.    Historical Provider, MD   Physical Exam: Filed Vitals:   08/13/15 1249 08/13/15 1330 08/13/15 1430 08/13/15 1530  BP: 185/85 176/92 193/89 152/78  Pulse: 71 71 69 66  Temp: 100.2 F (37.9 C)     TempSrc: Oral     Resp: 20     SpO2: 98% 100% 99% 98%    Wt Readings from Last 3 Encounters:  08/05/15 77.474 kg (170 lb 12.8 oz)  07/29/15 81.194 kg (179 lb)   07/24/15 78.019 kg (172 lb)    General: Appears anxious and uncomfortable.  Eyes:  PERRL, EOMI, normal lids, iris ENT: grossly normal hearing, lips & tongue dry. Several missing teeth Neck: no lymphadenopathy, masses or thyromegaly Cardiovascular: regular rate and rhythm, no murmurs, rubs or gallops. No lower extremity edema   Respiratory: clear to auscultation bilaterally, no wheezing, rhonchi or rales. Normal respiratory effort. Abdomen: soft,non-tender, active bowel sounds Skin: no rash or induration seen on limited exam. No open lesions. Musculoskeletal:  grossly normal tone in both upper and lower extremities Psychiatric: grossly anxious mood and affect, speech fluent and appropriate Neurologic: CN 2-12 grossly intact, moves all extremities in coordinated fashion.          Labs on Admission:  Basic Metabolic Panel:  Recent Labs Lab 08/13/15 1240  NA 138  K 4.3  CL 105  CO2 22  GLUCOSE 113*  BUN 10  CREATININE 1.20  CALCIUM 9.0    Liver Function Tests: No results for input(s): AST, ALT, ALKPHOS, BILITOT, PROT, ALBUMIN in the last 168 hours. No results for input(s): LIPASE, AMYLASE in the last 168 hours. No results for input(s): AMMONIA in the last 168 hours.  CBC:  Recent Labs Lab 08/13/15 1240  WBC 5.4  HGB 12.8*  HCT 37.2*  MCV 82.5  PLT 193     Radiological Exams on Admission: Dg Chest Port 1 View  08/13/2015  CLINICAL DATA:  Fever and dehydration. History of prostate carcinoma EXAM: PORTABLE CHEST 1 VIEW COMPARISON:  July 31, 2015 FINDINGS: There is no edema or consolidation. Heart is upper normal in size with pulmonary vascularity within normal limits. No adenopathy. No bone lesions. IMPRESSION: No edema or consolidation. Electronically Signed   By: Lowella Grip III M.D.   On: 08/13/2015 13:33    EKG: Independently reviewed.    Assessment/Plan Active Problems:   Hypertension   Diabetes mellitus type 2 in nonobese (HCC)   BPH (benign  prostatic hyperplasia)   UTI (lower urinary tract infection)   Fever   Diarrhea   Hyperlipidemia  Diarrhea. Rule out viral infection versus recent antibiotics for Flu therapy, doubt bacterial etiology. Received 1 l bolus IVF    Admit to Med surg Obs    IV fluids -  Enteric precautions -  C. Diff -  GI panel -  Clear liquid diet and advance as tolerated -  Okay to start imodium if C. Diff is negative   UTI suggested by dysuria, with  UA +nitrites. Low grade Fever, other VSS .  -  F/u urine culture -  Ceftriaxone initiated at the ED Start Pyridium     Hypertension BP 185/85 mmHg  Pulse 71  Temp(Src) 100.2 F (37.9 C) (Oral)  Resp 20  SpO2 98%  Continue antihypertensives with Catapres Add hydralazine when  necessary with parameters  Type II Diabetes with diabetic neuropathy  On oral agentsCurrent blood sugar level is 113 No results found for: HGBA1C Hgb A1C   Hold home oral diabetic medications.  SSI Continue Neurontin for neuropathy.  Hyperlipidemia   Continue home statins    History of Prostate Cancer s/p seed implant, remote. Followed as outpatient by Uro Continue ditropan and Flomax  Physical Deconditioning. He has been hospitalized recently and shows obvious deconditioning Recommend PT/OT, Nutrition consult Palliative Care Consult to determine goals of care   Urinalysis    Component Value Date/Time   COLORURINE YELLOW 08/13/2015 1401   APPEARANCEUR TURBID* 08/13/2015 1401   LABSPEC 1.008 08/13/2015 1401   PHURINE 8.5* 08/13/2015 1401   GLUCOSEU NEGATIVE 08/13/2015 1401   HGBUR SMALL* 08/13/2015 1401   BILIRUBINUR NEGATIVE 08/13/2015 1401   KETONESUR 15* 08/13/2015 1401   PROTEINUR 30* 08/13/2015 1401   NITRITE POSITIVE* 08/13/2015 1401   LEUKOCYTESUR LARGE* 08/13/2015 1401       Code Status: Full Code DVT prophylaxis: on Lovenox  Disposition Plan: Med Surg    [facility] E,PA-C [facility] [email or web address] Password [email or web address]

## 2015-08-14 DIAGNOSIS — M160 Bilateral primary osteoarthritis of hip: Secondary | ICD-10-CM | POA: Diagnosis present

## 2015-08-14 DIAGNOSIS — Z87891 Personal history of nicotine dependence: Secondary | ICD-10-CM | POA: Diagnosis not present

## 2015-08-14 DIAGNOSIS — A047 Enterocolitis due to Clostridium difficile: Secondary | ICD-10-CM | POA: Diagnosis present

## 2015-08-14 DIAGNOSIS — E119 Type 2 diabetes mellitus without complications: Secondary | ICD-10-CM | POA: Diagnosis not present

## 2015-08-14 DIAGNOSIS — R197 Diarrhea, unspecified: Secondary | ICD-10-CM | POA: Diagnosis not present

## 2015-08-14 DIAGNOSIS — L8990 Pressure ulcer of unspecified site, unspecified stage: Secondary | ICD-10-CM | POA: Insufficient documentation

## 2015-08-14 DIAGNOSIS — R5381 Other malaise: Secondary | ICD-10-CM | POA: Insufficient documentation

## 2015-08-14 DIAGNOSIS — Z8546 Personal history of malignant neoplasm of prostate: Secondary | ICD-10-CM | POA: Diagnosis not present

## 2015-08-14 DIAGNOSIS — I10 Essential (primary) hypertension: Secondary | ICD-10-CM | POA: Diagnosis not present

## 2015-08-14 DIAGNOSIS — E876 Hypokalemia: Secondary | ICD-10-CM | POA: Diagnosis present

## 2015-08-14 DIAGNOSIS — E1140 Type 2 diabetes mellitus with diabetic neuropathy, unspecified: Secondary | ICD-10-CM | POA: Diagnosis present

## 2015-08-14 DIAGNOSIS — R509 Fever, unspecified: Secondary | ICD-10-CM | POA: Diagnosis not present

## 2015-08-14 DIAGNOSIS — Z7982 Long term (current) use of aspirin: Secondary | ICD-10-CM | POA: Diagnosis not present

## 2015-08-14 DIAGNOSIS — Z79899 Other long term (current) drug therapy: Secondary | ICD-10-CM | POA: Diagnosis not present

## 2015-08-14 DIAGNOSIS — E785 Hyperlipidemia, unspecified: Secondary | ICD-10-CM | POA: Diagnosis present

## 2015-08-14 DIAGNOSIS — N400 Enlarged prostate without lower urinary tract symptoms: Secondary | ICD-10-CM | POA: Diagnosis present

## 2015-08-14 DIAGNOSIS — A09 Infectious gastroenteritis and colitis, unspecified: Secondary | ICD-10-CM

## 2015-08-14 DIAGNOSIS — D696 Thrombocytopenia, unspecified: Secondary | ICD-10-CM | POA: Diagnosis present

## 2015-08-14 DIAGNOSIS — N390 Urinary tract infection, site not specified: Secondary | ICD-10-CM | POA: Diagnosis not present

## 2015-08-14 LAB — GASTROINTESTINAL PANEL BY PCR, STOOL (REPLACES STOOL CULTURE)

## 2015-08-14 LAB — COMPREHENSIVE METABOLIC PANEL
ALBUMIN: 2.2 g/dL — AB (ref 3.5–5.0)
ALK PHOS: 40 U/L (ref 38–126)
ALT: 15 U/L — ABNORMAL LOW (ref 17–63)
ANION GAP: 10 (ref 5–15)
AST: 22 U/L (ref 15–41)
BUN: 8 mg/dL (ref 6–20)
CO2: 22 mmol/L (ref 22–32)
CREATININE: 1.04 mg/dL (ref 0.61–1.24)
Calcium: 8.2 mg/dL — ABNORMAL LOW (ref 8.9–10.3)
Chloride: 110 mmol/L (ref 101–111)
GFR calc Af Amer: >60 mL/min (ref >60)
GFR calc non Af Amer: >60 mL/min (ref >60)
GLUCOSE: 100 mg/dL — AB (ref 65–99)
POTASSIUM: 3.3 mmol/L — AB (ref 3.5–5.1)
SODIUM: 142 mmol/L (ref 135–145)
TOTAL PROTEIN: 5.5 g/dL — AB (ref 6.5–8.1)
Total Bilirubin: 0.8 mg/dL (ref 0.3–1.2)

## 2015-08-14 LAB — C DIFFICILE QUICK SCREEN W PCR REFLEX
C DIFFICILE (CDIFF) INTERP: POSITIVE
C Diff antigen: POSITIVE — AB
C Diff toxin: POSITIVE — AB

## 2015-08-14 LAB — CBC
HEMATOCRIT: 31.3 % — AB (ref 39.0–52.0)
HEMOGLOBIN: 10.8 g/dL — AB (ref 13.0–17.0)
MCH: 28.6 pg (ref 26.0–34.0)
MCHC: 34.5 g/dL (ref 30.0–36.0)
MCV: 82.8 fL (ref 78.0–100.0)
Platelets: 134 10*3/uL — ABNORMAL LOW (ref 150–400)
RBC: 3.78 MIL/uL — ABNORMAL LOW (ref 4.22–5.81)
RDW: 14.4 % (ref 11.5–15.5)
WBC: 4.5 10*3/uL (ref 4.0–10.5)

## 2015-08-14 LAB — GLUCOSE, CAPILLARY
GLUCOSE-CAPILLARY: 92 mg/dL (ref 65–99)
Glucose-Capillary: 127 mg/dL — ABNORMAL HIGH (ref 65–99)
Glucose-Capillary: 164 mg/dL — ABNORMAL HIGH (ref 65–99)
Glucose-Capillary: 220 mg/dL — ABNORMAL HIGH (ref 65–99)

## 2015-08-14 MED ORDER — DEXTROSE 5 % IV SOLN
1.0000 g | INTRAVENOUS | Status: DC
  Administered 2015-08-14: 1 g via INTRAVENOUS
  Filled 2015-08-14 (×2): qty 10

## 2015-08-14 MED ORDER — BOOST / RESOURCE BREEZE PO LIQD
1.0000 | Freq: Two times a day (BID) | ORAL | Status: DC
  Administered 2015-08-14 – 2015-08-19 (×10): 1 via ORAL

## 2015-08-14 MED ORDER — METRONIDAZOLE IN NACL 5-0.79 MG/ML-% IV SOLN: 500.0000 mg | Freq: Three times a day (TID) | INTRAVENOUS | Status: DC

## 2015-08-14 MED ORDER — VANCOMYCIN 50 MG/ML ORAL SOLUTION
125.0000 mg | Freq: Four times a day (QID) | ORAL | Status: DC
  Administered 2015-08-14 – 2015-08-19 (×21): 125 mg via ORAL
  Filled 2015-08-14 (×2): qty 2.5
  Filled 2015-08-14 (×2): qty 3
  Filled 2015-08-14 (×2): qty 2.5
  Filled 2015-08-14: qty 3
  Filled 2015-08-14 (×4): qty 2.5
  Filled 2015-08-14: qty 3
  Filled 2015-08-14 (×2): qty 2.5
  Filled 2015-08-14: qty 3
  Filled 2015-08-14 (×16): qty 2.5

## 2015-08-14 MED ORDER — ADULT MULTIVITAMIN W/MINERALS CH
1.0000 | ORAL_TABLET | Freq: Every day | ORAL | Status: DC
  Administered 2015-08-14 – 2015-08-19 (×6): 1 via ORAL
  Filled 2015-08-14 (×7): qty 1

## 2015-08-14 NOTE — Evaluation (Signed)
Physical Therapy Evaluation and Discharge  Patient Details Name: Mark Valenzuela MRN: [id number] DOB: 26-Jan-1923 Today's Date: 08/14/2015   History of Present Illness  Mark Valenzuela is a 82 y.o. male with a Past Medical History of DM, HTN, prostate Ca, who presents with UTI, persistent diarrhea and physical deconditioniing. +c-diff Pt returned home 3/25 from short-term SNF stay.    Clinical Impression  Patient evaluated by Physical Therapy with no further acute PT needs identified. Patient with recent short-term rehab stay and demonstrated good carryover of safe techniques with all mobility. All education has been completed and the patient has no further questions. Resume HHPT on discharge. PT is signing off. Thank you for this referral.     Follow Up Recommendations Home health PT;Supervision - Intermittent    Equipment Recommendations  None recommended by PT    Recommendations for Other Services       Precautions / Restrictions Precautions Precautions: Fall Restrictions Weight Bearing Restrictions: No      Mobility  Bed Mobility Overal bed mobility: Needs Assistance Bed Mobility: Supine to Sit     Supine to sit: Min guard;HOB elevated     General bed mobility comments: incr effort, however pt had 5 bed pads under him making it difficult to scoot to EOB  Transfers Overall transfer level: Needs assistance Equipment used: Rolling walker (2 wheeled) Transfers: Sit to/from Stand Sit to Stand: Min guard         General transfer comment: guarding for safety; no assist needed, denied dizziness  Ambulation/Gait Ambulation/Gait assistance: Min guard Ambulation Distance (Feet): 25 Feet Assistive device: Rolling walker (2 wheeled) Gait Pattern/deviations: Step-through pattern;Decreased stride length;Narrow base of support   Gait velocity interpretation: Below normal speed for age/gender General Gait Details: occasional steps with narrow base of support; proper use of  RW  Stairs            Wheelchair Mobility    Modified Rankin (Stroke Patients Only)       Balance Overall balance assessment: Needs assistance Sitting-balance support: No upper extremity supported;Feet supported Sitting balance-Leahy Scale: Good     Standing balance support: No upper extremity supported Standing balance-Leahy Scale: Fair                               Pertinent Vitals/Pain Pain Assessment: No/denies pain    Home Living Family/patient expects to be discharged to:: Private residence Living Arrangements: Spouse/significant other Available Help at Discharge: Family Type of Home: House Home Access: Ramped entrance     Home Layout: One level Home Equipment: Environmental consultant - 2 wheels;Wheelchair - manual      Prior Function Level of Independence: Independent with assistive device(s)         Comments: used walker PTA due to Lt hip pain; uses w/c if going out to appointment; washes at sink (not shower)      Hand Dominance        Extremity/Trunk Assessment   Upper Extremity Assessment: Generalized weakness           Lower Extremity Assessment: Generalized weakness      Cervical / Trunk Assessment: Normal  Communication   Communication: HOH  Cognition Arousal/Alertness: Awake/alert Behavior During Therapy: WFL for tasks assessed/performed Overall Cognitive Status: No family/caregiver present to determine baseline cognitive functioning (NT reports slept thru b'fast, pt denies and states he ate ??)  General Comments      Exercises        Assessment/Plan    PT Assessment All further PT needs can be met in the next venue of care  PT Diagnosis Generalized weakness   PT Problem List Decreased strength;Decreased balance  PT Treatment Interventions     PT Goals (Current goals can be found in the Care Plan section) Acute Rehab PT Goals Patient Stated Goal: keep my strength up while I'm here PT Goal  Formulation: All assessment and education complete, DC therapy    Frequency     Barriers to discharge        Co-evaluation               End of Session Equipment Utilized During Treatment: Gait belt Activity Tolerance: Patient tolerated treatment well Patient left: in chair;with call bell/phone within reach Nurse Communication: Mobility status;Other (comment) (no chair alarm in room; pt verbalizes not to get up alone)    Functional Assessment Tool Used: clinical judgement Functional Limitation: Mobility: Walking and moving around Mobility: Walking and Moving Around Current Status (G6815): At least 1 percent but less than 20 percent impaired, limited or restricted Mobility: Walking and Moving Around Goal Status [phone]): At least 1 percent but less than 20 percent impaired, limited or restricted Mobility: Walking and Moving Around Discharge Status [phone]): At least 1 percent but less than 20 percent impaired, limited or restricted    Time: 1220-1247 PT Time Calculation (min) (ACUTE ONLY): 27 min   Charges:   PT Evaluation $PT Eval Low Complexity: 1 Procedure PT Treatments $Gait Training: 8-22 mins   PT G Codes:   PT G-Codes **NOT FOR INPATIENT CLASS** Functional Assessment Tool Used: clinical judgement Functional Limitation: Mobility: Walking and moving around Mobility: Walking and Moving Around Current Status (D4373): At least 1 percent but less than 20 percent impaired, limited or restricted Mobility: Walking and Moving Around Goal Status [phone]): At least 1 percent but less than 20 percent impaired, limited or restricted Mobility: Walking and Moving Around Discharge Status [phone]): At least 1 percent but less than 20 percent impaired, limited or restricted    Kinze Labo 08/14/2015, 1:22 PM Pager [phone]

## 2015-08-14 NOTE — Progress Notes (Signed)
Patient ID: Mark Valenzuela, male   DOB: [birth year], 82 y.o.   MRN: [id number]  [facility] PROGRESS NOTE  MILEZ LAGUEUX [id number] DOB: 01-01-1923 DOA: 08/13/2015 PCP: [name], MD   Brief narrative:    82 y.o. Male with known hypertension, prostate cancer, diabetic neuropathy, recently hospitalized from 3/6 to 3/8 for Flu, discharged to [place], now presented to the ED for evaluation of progressively worsening watery diarrhea several days in duration, dysuria, hematuria.   In ED, he had low grade fever at 100.2 F, other vital signs were stable. CBC normal. CMET unremarkable  Assessment/Plan:    Active Problems:   Diarrhea - C. Diff +, start oral vanc - monitor clinical response - stop imodium    UTI - placed on Rocephin - follow up on urine culture     Essential HTN - reasonably stable for his age      Thrombocytopenia - mild, reactive, CBC in AM    Hypokalemia - mild and potassium loss in stool from diarrheal illness C. 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  Family Communication:  plan of care discussed with the patient, daughter over the phone  Disposition Plan: Home by 08/17/2015  IV access:  Peripheral IV  Procedures and diagnostic studies:    Dg Chest 2 View  07/21/2015  CLINICAL DATA:  Generalized weakness with diarrhea and shortness of breath. EXAM: CHEST  2 VIEW COMPARISON:  Chest radiographs 01/17/2001 and 07/28/2005. Lumbar spine radiographs 01/31/2014. FINDINGS: There are lower lung volumes. Allowing for this, the heart size and mediastinal contours are stable with mild vascular tortuosity. There is diffuse central airway thickening without focal airspace disease or edema. The previously demonstrated right lung nodule is not well visualized. There is no pleural effusion or pneumothorax. Mild generalized osteosclerosis is suggested (not definite).  IMPRESSION: 1. Low lung volumes with generalized central airway thickening. No focal airspace disease or edema. 2. Suggested mild generalized osteosclerosis. In this patient with apparent prostate cancer, this could reflect metastatic disease. Correlation with serum PSA recommended. Electronically Signed   By: Richardean Sale M.D.   On: 07/21/2015 15:47   Dg Chest Port 1 View  08/13/2015  CLINICAL DATA:  Fever and dehydration. History of prostate carcinoma EXAM: PORTABLE CHEST 1 VIEW COMPARISON:  July 31, 2015 FINDINGS: There is no edema or consolidation. Heart is upper normal in size with pulmonary vascularity within normal limits. No adenopathy. No bone lesions. IMPRESSION: No edema or consolidation. Electronically Signed   By: Lowella Grip III M.D.   On: 08/13/2015 13:33   Dg Abd Portable 2v  07/22/2015  CLINICAL DATA:  Abdominal pain with weakness and diarrhea. EXAM: PORTABLE ABDOMEN - 2 VIEW COMPARISON:  Lumbar spine and hip radiographs 01/31/2014. FINDINGS: Gas is present throughout the small and large bowel. There are scattered air-fluid levels within the colon on the decubitus view. No significant bowel distention, wall thickening or free intraperitoneal air demonstrated. Severe degenerative changes are again noted at both hips. There are mild degenerative changes within the lumbar spine. Prostate brachytherapy seeds are noted. IMPRESSION: Nonobstructive bowel gas pattern. Scattered air-fluid levels within the colon, nonspecific in the setting of diarrhea. Severe osteoarthritis of both hips. Electronically Signed   By: Richardean Sale M.D.   On: 07/22/2015 09:47    Medical Consultants:  None  Other Consultants:  None  IAnti-Infectives:  Rocephin 3/29 --> Oral Vanc 3/30 -->  Faye Ramsay, MD  [facility] Pager [phone]  If 7PM-7AM, please contact night-coverage [email or web address] Password [email or web address] 08/14/2015, 3:00 PM   LOS: 1 day   HPI/Subjective: No events overnight.    Objective: Filed Vitals:   08/14/15 0558 08/14/15 0700 08/14/15 0934 08/14/15 1332  BP: 143/75  154/58 157/65  Pulse: 66   80  Temp: 99.2 F (37.3 C)   98 F (36.7 C)  TempSrc: Oral   Oral  Resp: 18   18  Height:      Weight: 75.751 kg (167 lb) 82.237 kg (181 lb 4.8 oz)    SpO2: 100%   93%    Intake/Output Summary (Last 24 hours) at 08/14/15 1500 Last data filed at 08/14/15 1352  Gross per 24 hour  Intake    240 ml  Output    800 ml  Net   -560 ml    Exam:   General:  Pt is alert, follows commands appropriately, not in acute distress  Cardiovascular: Regular rate and rhythm, no rubs, no gallops  Respiratory: Clear to auscultation bilaterally, no wheezing, no crackles, no rhonchi  Abdomen: Soft, non tender, non distended, bowel sounds present, no guarding  Data Reviewed: Basic Metabolic Panel:  Recent Labs Lab 08/13/15 1240 08/14/15 0644  NA 138 142  K 4.3 3.3*  CL 105 110  CO2 22 22  GLUCOSE 113* 100*  BUN 10 8  CREATININE 1.20 1.04  CALCIUM 9.0 8.2*   Liver Function Tests:  Recent Labs Lab 08/14/15 0644  AST 22  ALT 15*  ALKPHOS 40  BILITOT 0.8  PROT 5.5*  ALBUMIN 2.2*   CBC:  Recent Labs Lab 08/13/15 1240 08/14/15 0445  WBC 5.4 4.5  HGB 12.8* 10.8*  HCT 37.2* 31.3*  MCV 82.5 82.8  PLT 193 134*   Cardiac Enzymes: No results for input(s): CKTOTAL, CKMB, CKMBINDEX, TROPONINI in the last 168 hours. BNP: Invalid input(s): POCBNP CBG:  Recent Labs Lab 08/13/15 1730 08/13/15 2204 08/14/15 0814 08/14/15 1203  GLUCAP 84 118* 92 127*    Recent Results (from the past 240 hour(s))  C difficile quick scan w PCR reflex     Status: Abnormal   Collection Time: 08/14/15  6:31 AM  Result Value Ref Range Status   C Diff antigen POSITIVE (A) NEGATIVE Final   C Diff toxin POSITIVE (A) NEGATIVE Final   C Diff interpretation Positive for toxigenic C. difficile  Final    Comment: CRITICAL RESULT CALLED TO, READ BACK BY AND VERIFIED  WITH: S.JONES RN AT 0830 08/14/15 BY A.DAVIS   Gastrointestinal Panel by PCR , Stool     Status: None   Collection Time: 08/14/15  6:31 AM  Result Value Ref Range Status   Campylobacter species NOT DETECTED NOT DETECTED Final   Plesimonas shigelloides NOT DETECTED NOT DETECTED Final   Salmonella species NOT DETECTED NOT DETECTED Final   Yersinia enterocolitica NOT DETECTED NOT DETECTED Final   Vibrio species NOT DETECTED NOT DETECTED Final   Vibrio cholerae NOT DETECTED NOT DETECTED Final   Enteroaggregative E coli (EAEC) NOT DETECTED NOT DETECTED Final   Enteropathogenic E coli (EPEC) NOT DETECTED NOT DETECTED Final   Enterotoxigenic E coli (ETEC) NOT DETECTED NOT DETECTED Final   Shiga like toxin producing E coli (STEC) NOT DETECTED NOT DETECTED Final   E. coli O157 NOT DETECTED NOT DETECTED Final   Shigella/Enteroinvasive E coli (EIEC) NOT DETECTED NOT DETECTED Final   Cryptosporidium NOT  DETECTED NOT DETECTED Final   Cyclospora cayetanensis NOT DETECTED NOT DETECTED Final   Entamoeba histolytica NOT DETECTED NOT DETECTED Final   Giardia lamblia NOT DETECTED NOT DETECTED Final   Adenovirus F40/41 NOT DETECTED NOT DETECTED Final   Astrovirus NOT DETECTED NOT DETECTED Final   Norovirus GI/GII NOT DETECTED NOT DETECTED Final   Rotavirus A NOT DETECTED NOT DETECTED Final   Sapovirus (I, II, IV, and V) NOT DETECTED NOT DETECTED Final    Scheduled Meds: . aspirin EC  81 mg Oral Daily  . atorvastatin  10 mg Oral Daily  . cloNIDine  0.2 mg Oral BID  . enoxaparin (LOVENOX) injection  40 mg Subcutaneous Q24H  . feeding supplement  1 Container Oral BID BM  . gabapentin  300 mg Oral BID  . insulin aspart  0-9 Units Subcutaneous TID WC  . multivitamin with minerals  1 tablet Oral Daily  . oxybutynin  10 mg Oral QHS  . rOPINIRole  0.25 mg Oral QHS  . tamsulosin  0.4 mg Oral Daily  . vancomycin  125 mg Oral QID   Continuous Infusions: . sodium chloride 100 mL/hr at 08/13/15 2147

## 2015-08-14 NOTE — Progress Notes (Signed)
CRITICAL VALUE ALERT  Critical value received:  C. diff  Date of notification:  08/14/15  Time of notification:  0830  Critical value read back:Yes.    Nurse who received alert:  Precious Gilding RN  MD notified (1st page):  Dr. Doyle Askew  Time of first page:  1040  Responding MD:  Dr. Doyle Askew  Time MD responded:  [phone]

## 2015-08-14 NOTE — Care Management Obs Status (Signed)
[facility] [facility] NOTIFICATION   Patient Details  Name: JAVAN CUSTALOW MRN: [id number] Date of Birth: [birth year]   Medicare Observation Status Notification Given:  Yes    Carles Collet, RN 08/14/2015, 12:05 PM

## 2015-08-14 NOTE — Progress Notes (Addendum)
Initial Nutrition Assessment  DOCUMENTATION CODES:   Not applicable  INTERVENTION:   -Boost Breeze po BID, each supplement provides 250 kcal and 9 grams of protein -MVI daily -Downgrade diet to dysphagia 2 consistency, due to masticatory difficulty  NUTRITION DIAGNOSIS:   Biting/chewing difficulty related to  (poor dentition) as evidenced by per patient/family report.  GOAL:   Patient will meet greater than or equal to 90% of their needs  MONITOR:   PO intake, Labs, Weight trends, Skin, I & O's  REASON FOR ASSESSMENT:   Consult Poor PO  ASSESSMENT:   Mark Valenzuela is a 82 y.o. male With a history of hypertension, prostate cancer, diabetic neuropathy, recently hospitalized from 3/6 to 3/8 for Flu discharged to [place], now presenting to the ED as instructed by his PCP, with diarrhea since last Saturday. He reports worst symptoms present on Sunday, but not improved since. He reports his stools to be liquid,no blood was visible. He reports generalized weakness, fatigue, poor oral intake, subjective fevers and chills. He denies shortness of breath but does report light green cough. He denies any chest pain or palpitations. He also reports dysuria, without gross hematuria.   Pt admitted with diarrhea.   Hx obtained from pt at bedside. He reports a decreased appetite PTA due to diarrhea; he reveals that he limits intake to minimize frequency of loose stools. Additionally, pt reports it is difficult for him to chew tough meats and raw fruits and vegetables, as he his missing several teeth. He does not have dentures. He estimates he consumed 50% of his breakfast this morning.   Pt estimates UBW is around 165#. Noted wt gain over the past month.   Nutrition-Focused physical exam completed. Findings are no fat depletion, mild muscle depletion, and no edema. Pt with muscle depletion in lower extremities only; he reveals that he has a "bad knee" on his right side. He was receiving  PT PTA per his report.   Discussed importance of good meal intake to promote healing. Pt reports he did not take any supplements PTA, however, pt with increased nutritional needs secondary to wound healing.   Labs reviewed: K: 3.3, CBGS: 118.   Diet Order:  Diet Heart Room service appropriate?: Yes; Fluid consistency:: Thin  Skin:  Wound (see comment) (DTI rt and lt heels)  Last BM:  08/13/15  Height:   Ht Readings from Last 1 Encounters:  08/13/15 5\' 7"  (1.702 m)    Weight:   Wt Readings from Last 1 Encounters:  08/14/15 181 lb 4.8 oz (82.237 kg)    Ideal Body Weight:  67.3 kg  BMI:  Body mass index is 28.39 kg/(m^2).  Estimated Nutritional Needs:   Kcal:  1800-2000  Protein:  85-100 grams  Fluid:  1.8-2.0 L  EDUCATION NEEDS:   Education needs addressed  Saige Busby A. Jimmye Norman, RD, LDN, CDE Pager: [phone] After hours Pager: [phone]

## 2015-08-15 DIAGNOSIS — R509 Fever, unspecified: Secondary | ICD-10-CM

## 2015-08-15 LAB — BASIC METABOLIC PANEL
ANION GAP: 8 (ref 5–15)
BUN: 10 mg/dL (ref 6–20)
CALCIUM: 8.5 mg/dL — AB (ref 8.9–10.3)
CO2: 23 mmol/L (ref 22–32)
Chloride: 110 mmol/L (ref 101–111)
Creatinine, Ser: 1.11 mg/dL (ref 0.61–1.24)
GFR calc non Af Amer: 56 mL/min — ABNORMAL LOW (ref >60)
GLUCOSE: 141 mg/dL — AB (ref 65–99)
POTASSIUM: 3.7 mmol/L (ref 3.5–5.1)
Sodium: 141 mmol/L (ref 135–145)

## 2015-08-15 LAB — HEMOGLOBIN A1C
Hgb A1c MFr Bld: 7.8 % — ABNORMAL HIGH (ref 4.8–5.6)
MEAN PLASMA GLUCOSE: 177 mg/dL

## 2015-08-15 LAB — CBC
HCT: 32.4 % — ABNORMAL LOW (ref 39.0–52.0)
Hemoglobin: 10.8 g/dL — ABNORMAL LOW (ref 13.0–17.0)
MCH: 27.4 pg (ref 26.0–34.0)
MCHC: 33.3 g/dL (ref 30.0–36.0)
MCV: 82.2 fL (ref 78.0–100.0)
PLATELETS: DECREASED 10*3/uL (ref 150–400)
RBC: 3.94 MIL/uL — AB (ref 4.22–5.81)
RDW: 14.6 % (ref 11.5–15.5)
WBC: 4.7 10*3/uL (ref 4.0–10.5)

## 2015-08-15 LAB — URINE CULTURE

## 2015-08-15 LAB — GLUCOSE, CAPILLARY
GLUCOSE-CAPILLARY: 141 mg/dL — AB (ref 65–99)
GLUCOSE-CAPILLARY: 193 mg/dL — AB (ref 65–99)
Glucose-Capillary: 157 mg/dL — ABNORMAL HIGH (ref 65–99)
Glucose-Capillary: 152 mg/dL — ABNORMAL HIGH (ref 65–99)

## 2015-08-15 MED ORDER — DEXTROSE 5 % IV SOLN
2.0000 g | INTRAVENOUS | Status: DC
  Administered 2015-08-15 – 2015-08-19 (×5): 2 g via INTRAVENOUS
  Filled 2015-08-15 (×5): qty 2

## 2015-08-15 NOTE — Progress Notes (Signed)
Patient ID: Mark Valenzuela, male   DOB: 12/25/1922, 82 y.o.   MRN: [id number]  [facility] PROGRESS NOTE  Mark Valenzuela [id number] DOB: 1922-08-10 DOA: 08/13/2015 PCP: Mark Hams, MD   Brief narrative:    82 y.o. Male with known hypertension, prostate cancer, diabetic neuropathy, recently hospitalized from 3/6 to 3/8 for Flu, discharged to [place], now presented to the ED for evaluation of progressively worsening watery diarrhea several days in duration, dysuria, hematuria.   In ED, he had low grade fever at 100.2 F, other vital signs were stable. CBC normal. CMET unremarkable  Assessment/Plan:    Active Problems:   Diarrhea - C. Diff +, continue oral vanc day #2 - monitor clinical response    UTI - placed on Rocephin - follow up on urine culture     Essential HTN - reasonably stable for his age      Thrombocytopenia - mild, reactive, CBC in AM    Hypokalemia - mild and potassium loss in stool from diarrheal illness C. 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  Family Communication:  plan of care discussed with the patient, daughter and wife at bedside  Disposition Plan: Home by 08/17/2015  IV access:  Peripheral IV  Procedures and diagnostic studies:    Dg Chest 2 View  07/21/2015  CLINICAL DATA:  Generalized weakness with diarrhea and shortness of breath. EXAM: CHEST  2 VIEW COMPARISON:  Chest radiographs 01/17/2001 and 07/28/2005. Lumbar spine radiographs 01/31/2014. FINDINGS: There are lower lung volumes. Allowing for this, the heart size and mediastinal contours are stable with mild vascular tortuosity. There is diffuse central airway thickening without focal airspace disease or edema. The previously demonstrated right lung nodule is not well visualized. There is no pleural effusion or pneumothorax. Mild generalized osteosclerosis is suggested  (not definite). IMPRESSION: 1. Low lung volumes with generalized central airway thickening. No focal airspace disease or edema. 2. Suggested mild generalized osteosclerosis. In this patient with apparent prostate cancer, this could reflect metastatic disease. Correlation with serum PSA recommended. Electronically Signed   By: Richardean Sale M.D.   On: 07/21/2015 15:47   Dg Chest Port 1 View  08/13/2015  CLINICAL DATA:  Fever and dehydration. History of prostate carcinoma EXAM: PORTABLE CHEST 1 VIEW COMPARISON:  July 31, 2015 FINDINGS: There is no edema or consolidation. Heart is upper normal in size with pulmonary vascularity within normal limits. No adenopathy. No bone lesions. IMPRESSION: No edema or consolidation. Electronically Signed   By: Lowella Grip III M.D.   On: 08/13/2015 13:33   Dg Abd Portable 2v  07/22/2015  CLINICAL DATA:  Abdominal pain with weakness and diarrhea. EXAM: PORTABLE ABDOMEN - 2 VIEW COMPARISON:  Lumbar spine and hip radiographs 01/31/2014. FINDINGS: Gas is present throughout the small and large bowel. There are scattered air-fluid levels within the colon on the decubitus view. No significant bowel distention, wall thickening or free intraperitoneal air demonstrated. Severe degenerative changes are again noted at both hips. There are mild degenerative changes within the lumbar spine. Prostate brachytherapy seeds are noted. IMPRESSION: Nonobstructive bowel gas pattern. Scattered air-fluid levels within the colon, nonspecific in the setting of diarrhea. Severe osteoarthritis of both hips. Electronically Signed   By: Richardean Sale M.D.   On: 07/22/2015 09:47    Medical Consultants:  None  Other Consultants:  None  IAnti-Infectives:   Rocephin 3/29 --> Oral Vanc 3/30 -->  Faye Ramsay, MD  [facility] Pager [phone]  If 7PM-7AM, please contact night-coverage [email or web address] Password [facility] 08/15/2015, 4:40 PM   LOS: 2 days   HPI/Subjective: No events  overnight.   Objective: Filed Vitals:   08/14/15 1332 08/14/15 2143 08/15/15 0655 08/15/15 1440  BP: 157/65 152/80 167/72 141/67  Pulse: 80 72 62 68  Temp: 98 F (36.7 C) 99.9 F (37.7 C) 98.3 F (36.8 C) 98.3 F (36.8 C)  TempSrc: Oral Oral  Oral  Resp: 18 18 17 20   Height:      Weight:   76.9 kg (169 lb 8.5 oz)   SpO2: 93% 99% 94% 94%    Intake/Output Summary (Last 24 hours) at 08/15/15 1640 Last data filed at 08/15/15 1506  Gross per 24 hour  Intake    410 ml  Output   1000 ml  Net   -590 ml    Exam:   General:  Pt is alert, follows commands appropriately, not in acute distress  Cardiovascular: Regular rate and rhythm, no rubs, no gallops  Respiratory: Clear to auscultation bilaterally, no wheezing, no crackles, no rhonchi  Abdomen: Soft, non tender, non distended, bowel sounds present, no guarding  Data Reviewed: Basic Metabolic Panel:  Recent Labs Lab 08/13/15 1240 08/14/15 0644 08/15/15 0531  NA 138 142 141  K 4.3 3.3* 3.7  CL 105 110 110  CO2 22 22 23   GLUCOSE 113* 100* 141*  BUN 10 8 10   CREATININE 1.20 1.04 1.11  CALCIUM 9.0 8.2* 8.5*   Liver Function Tests:  Recent Labs Lab 08/14/15 0644  AST 22  ALT 15*  ALKPHOS 40  BILITOT 0.8  PROT 5.5*  ALBUMIN 2.2*   CBC:  Recent Labs Lab 08/13/15 1240 08/14/15 0445 08/15/15 0531  WBC 5.4 4.5 4.7  HGB 12.8* 10.8* 10.8*  HCT 37.2* 31.3* 32.4*  MCV 82.5 82.8 82.2  PLT 193 134* PLATELET CLUMPS NOTED ON SMEAR, COUNT APPEARS DECREASED   Cardiac Enzymes: No results for input(s): CKTOTAL, CKMB, CKMBINDEX, TROPONINI in the last 168 hours. BNP: Invalid input(s): POCBNP CBG:  Recent Labs Lab 08/14/15 1203 08/14/15 1745 08/14/15 2335 08/15/15 0823 08/15/15 1141  GLUCAP 127* 220* 164* 141* 157*    Recent Results (from the past 240 hour(s))  Urine culture     Status: None   Collection Time: 08/13/15  2:01 PM  Result Value Ref Range Status   Specimen Description URINE, RANDOM  Final    Special Requests NONE  Final   Culture >=100,000 COLONIES/mL PSEUDOMONAS AERUGINOSA  Final   Report Status 08/15/2015 FINAL  Final   Organism ID, Bacteria PSEUDOMONAS AERUGINOSA  Final      Susceptibility   Pseudomonas aeruginosa - MIC*    CEFTAZIDIME 4 SENSITIVE Sensitive     CIPROFLOXACIN <=0.25 SENSITIVE Sensitive     GENTAMICIN 2 SENSITIVE Sensitive     IMIPENEM 1 SENSITIVE Sensitive     PIP/TAZO 8 SENSITIVE Sensitive     CEFEPIME 4 SENSITIVE Sensitive     * >=100,000 COLONIES/mL PSEUDOMONAS AERUGINOSA  C difficile quick scan w PCR reflex     Status: Abnormal   Collection Time: 08/14/15  6:31 AM  Result Value Ref Range Status   C Diff antigen POSITIVE (A) NEGATIVE Final   C Diff toxin POSITIVE (A) NEGATIVE Final   C Diff interpretation Positive for toxigenic C. difficile  Final    Comment: CRITICAL RESULT CALLED TO, READ BACK BY  AND VERIFIED WITH: S.JONES RN AT 0830 08/14/15 BY A.DAVIS   Gastrointestinal Panel by PCR , Stool     Status: None   Collection Time: 08/14/15  6:31 AM  Result Value Ref Range Status   Campylobacter species NOT DETECTED NOT DETECTED Final   Plesimonas shigelloides NOT DETECTED NOT DETECTED Final   Salmonella species NOT DETECTED NOT DETECTED Final   Yersinia enterocolitica NOT DETECTED NOT DETECTED Final   Vibrio species NOT DETECTED NOT DETECTED Final   Vibrio cholerae NOT DETECTED NOT DETECTED Final   Enteroaggregative E coli (EAEC) NOT DETECTED NOT DETECTED Final   Enteropathogenic E coli (EPEC) NOT DETECTED NOT DETECTED Final   Enterotoxigenic E coli (ETEC) NOT DETECTED NOT DETECTED Final   Shiga like toxin producing E coli (STEC) NOT DETECTED NOT DETECTED Final   E. coli O157 NOT DETECTED NOT DETECTED Final   Shigella/Enteroinvasive E coli (EIEC) NOT DETECTED NOT DETECTED Final   Cryptosporidium NOT DETECTED NOT DETECTED Final   Cyclospora cayetanensis NOT DETECTED NOT DETECTED Final   Entamoeba histolytica NOT DETECTED NOT DETECTED Final    Giardia lamblia NOT DETECTED NOT DETECTED Final   Adenovirus F40/41 NOT DETECTED NOT DETECTED Final   Astrovirus NOT DETECTED NOT DETECTED Final   Norovirus GI/GII NOT DETECTED NOT DETECTED Final   Rotavirus A NOT DETECTED NOT DETECTED Final   Sapovirus (I, II, IV, and V) NOT DETECTED NOT DETECTED Final    Scheduled Meds: . aspirin EC  81 mg Oral Daily  . atorvastatin  10 mg Oral Daily  . ceFEPime (MAXIPIME) IV  2 g Intravenous Q24H  . cloNIDine  0.2 mg Oral BID  . enoxaparin (LOVENOX) injection  40 mg Subcutaneous Q24H  . feeding supplement  1 Container Oral BID BM  . gabapentin  300 mg Oral BID  . insulin aspart  0-9 Units Subcutaneous TID WC  . multivitamin with minerals  1 tablet Oral Daily  . oxybutynin  10 mg Oral QHS  . rOPINIRole  0.25 mg Oral QHS  . tamsulosin  0.4 mg Oral Daily  . vancomycin  125 mg Oral QID   Continuous Infusions:

## 2015-08-15 NOTE — Progress Notes (Signed)
Chaplain presented to the patient and his family present at this visit. The patient was being assisted by the Nursing staff to get out of bed to the chair. Chaplain encouraged, and offered prayer of healing and wholeness for the patient and wife and daughter present. Chaplain will continue to follow as needed. Chaplain Yaakov Guthrie [phone]

## 2015-08-16 DIAGNOSIS — I10 Essential (primary) hypertension: Secondary | ICD-10-CM

## 2015-08-16 DIAGNOSIS — N390 Urinary tract infection, site not specified: Secondary | ICD-10-CM

## 2015-08-16 LAB — CBC
HEMATOCRIT: 33.2 % — AB (ref 39.0–52.0)
HEMOGLOBIN: 10.6 g/dL — AB (ref 13.0–17.0)
MCH: 26.7 pg (ref 26.0–34.0)
MCHC: 31.9 g/dL (ref 30.0–36.0)
MCV: 83.6 fL (ref 78.0–100.0)
Platelets: 135 10*3/uL — ABNORMAL LOW (ref 150–400)
RBC: 3.97 MIL/uL — AB (ref 4.22–5.81)
RDW: 14.6 % (ref 11.5–15.5)
WBC: 4.7 10*3/uL (ref 4.0–10.5)

## 2015-08-16 LAB — GLUCOSE, CAPILLARY
GLUCOSE-CAPILLARY: 163 mg/dL — AB (ref 65–99)
Glucose-Capillary: 147 mg/dL — ABNORMAL HIGH (ref 65–99)
Glucose-Capillary: 182 mg/dL — ABNORMAL HIGH (ref 65–99)
Glucose-Capillary: 159 mg/dL — ABNORMAL HIGH (ref 65–99)

## 2015-08-16 LAB — BASIC METABOLIC PANEL
Anion gap: 7 (ref 5–15)
BUN: 13 mg/dL (ref 6–20)
CHLORIDE: 109 mmol/L (ref 101–111)
CO2: 24 mmol/L (ref 22–32)
Calcium: 8.4 mg/dL — ABNORMAL LOW (ref 8.9–10.3)
Creatinine, Ser: 1.12 mg/dL (ref 0.61–1.24)
GFR calc Af Amer: >60 mL/min (ref >60)
GFR calc non Af Amer: 55 mL/min — ABNORMAL LOW (ref >60)
GLUCOSE: 148 mg/dL — AB (ref 65–99)
POTASSIUM: 3.7 mmol/L (ref 3.5–5.1)
Sodium: 140 mmol/L (ref 135–145)

## 2015-08-16 MED ORDER — HYDROCORTISONE 2.5 % RE CREA
TOPICAL_CREAM | Freq: Four times a day (QID) | RECTAL | Status: DC
  Administered 2015-08-16: 1 via RECTAL
  Administered 2015-08-16 (×2): via RECTAL
  Administered 2015-08-17 (×3): 1 via RECTAL
  Administered 2015-08-19: 14:00:00 via RECTAL
  Filled 2015-08-16: qty 28.35

## 2015-08-16 NOTE — Progress Notes (Signed)
Patient ID: Mark Valenzuela, male   DOB: 1923/01/31, 82 y.o.   MRN: [id number]  [facility] PROGRESS NOTE  Mark Valenzuela [id number] DOB: 1922-12-29 DOA: 08/13/2015 PCP: [name], MD   Brief narrative:    82 y.o. Male with known hypertension, prostate cancer, diabetic neuropathy, recently hospitalized from 3/6 to 3/8 for Flu, discharged to [place], now presented to the ED for evaluation of progressively worsening watery diarrhea several days in duration, dysuria, hematuria.   In ED, he had low grade fever at 100.2 F, other vital signs were stable. CBC normal. CMET unremarkable  Assessment/Plan:    Active Problems:   Diarrhea - C. 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  Family Communication:  plan of care discussed with the patient, daughter and wife at bedside  Disposition Plan: Home by 08/18/2015  IV access:  Peripheral IV  Procedures and diagnostic studies:    Dg Chest 2 View  07/21/2015  CLINICAL DATA:  Generalized weakness with diarrhea and shortness of breath. EXAM: CHEST  2 VIEW COMPARISON:  Chest radiographs 01/17/2001 and 07/28/2005. Lumbar spine radiographs 01/31/2014. FINDINGS: There are lower lung volumes. Allowing for this, the heart size and mediastinal contours are stable with mild vascular tortuosity. There is diffuse central airway thickening without focal airspace disease or edema. The previously demonstrated right lung nodule is not well visualized. There is no pleural  effusion or pneumothorax. Mild generalized osteosclerosis is suggested (not definite). IMPRESSION: 1. Low lung volumes with generalized central airway thickening. No focal airspace disease or edema. 2. Suggested mild generalized osteosclerosis. In this patient with apparent prostate cancer, this could reflect metastatic disease. Correlation with serum PSA recommended. Electronically Signed   By: Richardean Sale M.D.   On: 07/21/2015 15:47   Dg Chest Port 1 View  08/13/2015  CLINICAL DATA:  Fever and dehydration. History of prostate carcinoma EXAM: PORTABLE CHEST 1 VIEW COMPARISON:  July 31, 2015 FINDINGS: There is no edema or consolidation. Heart is upper normal in size with pulmonary vascularity within normal limits. No adenopathy. No bone lesions. IMPRESSION: No edema or consolidation. Electronically Signed   By: Lowella Grip III M.D.   On: 08/13/2015 13:33   Dg Abd Portable 2v  07/22/2015  CLINICAL DATA:  Abdominal pain with weakness and diarrhea. EXAM: PORTABLE ABDOMEN - 2 VIEW COMPARISON:  Lumbar spine and hip radiographs 01/31/2014. FINDINGS: Gas is present throughout the small and large bowel. There are scattered air-fluid levels within the colon on the decubitus view. No significant bowel distention, wall thickening or free intraperitoneal air demonstrated. Severe degenerative changes are again noted at both hips. There are mild degenerative changes within the lumbar spine. Prostate brachytherapy seeds are noted. IMPRESSION: Nonobstructive bowel gas pattern. Scattered air-fluid levels within the colon, nonspecific in the setting of diarrhea. Severe osteoarthritis of both hips. Electronically Signed   By: Richardean Sale M.D.   On: 07/22/2015  09:47    Medical Consultants:  None  Other Consultants:  None  IAnti-Infectives:   Rocephin 3/29 --> 4/1 Maxipime 4/1 --> Oral Vanc 3/30 -->  Faye Ramsay, MD  [facility] Pager [phone]  If 7PM-7AM, please contact  night-coverage [email or web address] Password [email or web address] 08/16/2015, 12:08 PM   LOS: 3 days   HPI/Subjective: No events overnight. Reports feeling better.   Objective: Filed Vitals:   08/15/15 0655 08/15/15 1440 08/15/15 2233 08/16/15 0516  BP: 167/72 141/67 178/80 145/64  Pulse: 62 68 77 63  Temp: 98.3 F (36.8 C) 98.3 F (36.8 C) 98 F (36.7 C) 98.6 F (37 C)  TempSrc:  Oral    Resp: 17 20 18 18   Height:      Weight: 76.9 kg (169 lb 8.5 oz)     SpO2: 94% 94% 99% 99%    Intake/Output Summary (Last 24 hours) at 08/16/15 1208 Last data filed at 08/16/15 1109  Gross per 24 hour  Intake    690 ml  Output    950 ml  Net   -260 ml    Exam:   General:  Pt is alert, follows commands appropriately, not in acute distress  Cardiovascular: Regular rate and rhythm, no rubs, no gallops  Respiratory: Clear to auscultation bilaterally, no wheezing, no crackles, no rhonchi  Abdomen: Soft, non tender, non distended, bowel sounds present, no guarding  Data Reviewed: Basic Metabolic Panel:  Recent Labs Lab 08/13/15 1240 08/14/15 0644 08/15/15 0531 08/16/15 0554  NA 138 142 141 140  K 4.3 3.3* 3.7 3.7  CL 105 110 110 109  CO2 22 22 23 24   GLUCOSE [id number]* 100* 141* 148*  BUN 10 8 10 13   CREATININE 1.20 1.04 1.11 1.12  CALCIUM 9.0 8.2* 8.5* 8.4*   Liver Function Tests:  Recent Labs Lab 08/14/15 0644  AST 22  ALT 15*  ALKPHOS 40  BILITOT 0.8  PROT 5.5*  ALBUMIN 2.2*   CBC:  Recent Labs Lab 08/13/15 1240 08/14/15 0445 08/15/15 0531 08/16/15 0554  WBC 5.4 4.5 4.7 4.7  HGB 12.8* 10.8* 10.8* 10.6*  HCT 37.2* 31.3* 32.4* 33.2*  MCV 82.5 82.8 82.2 83.6  PLT 193 134* PLATELET CLUMPS NOTED ON SMEAR, COUNT APPEARS DECREASED 135*   CBG:  Recent Labs Lab 08/15/15 0823 08/15/15 1141 08/15/15 1651 08/15/15 2232 08/16/15 0755  GLUCAP 141* 157* 152* 193* 147*    Recent Results (from the past 240 hour(s))  Urine culture     Status: None   Collection Time: 08/13/15  2:01  PM  Result Value Ref Range Status   Specimen Description URINE, RANDOM  Final   Special Requests NONE  Final   Culture >=100,000 COLONIES/mL PSEUDOMONAS AERUGINOSA  Final   Report Status 08/15/2015 FINAL  Final   Organism ID, Bacteria PSEUDOMONAS AERUGINOSA  Final      Susceptibility   Pseudomonas aeruginosa - MIC*    CEFTAZIDIME 4 SENSITIVE Sensitive     CIPROFLOXACIN <=0.25 SENSITIVE Sensitive     GENTAMICIN 2 SENSITIVE Sensitive     IMIPENEM 1 SENSITIVE Sensitive     PIP/TAZO 8 SENSITIVE Sensitive     CEFEPIME 4 SENSITIVE Sensitive     * >=100,000 COLONIES/mL PSEUDOMONAS AERUGINOSA  C difficile quick scan w PCR reflex     Status: Abnormal   Collection Time: 08/14/15  6:31 AM  Result Value Ref Range Status   C Diff antigen POSITIVE (A) NEGATIVE Final   C Diff toxin POSITIVE (A) NEGATIVE Final   C  Diff interpretation Positive for toxigenic C. difficile  Final    Comment: CRITICAL RESULT CALLED TO, READ BACK BY AND VERIFIED WITH: S.JONES RN AT 0830 08/14/15 BY A.DAVIS   Gastrointestinal Panel by PCR , Stool     Status: None   Collection Time: 08/14/15  6:31 AM  Result Value Ref Range Status   Campylobacter species NOT DETECTED NOT DETECTED Final   Plesimonas shigelloides NOT DETECTED NOT DETECTED Final   Salmonella species NOT DETECTED NOT DETECTED Final   Yersinia enterocolitica NOT DETECTED NOT DETECTED Final   Vibrio species NOT DETECTED NOT DETECTED Final   Vibrio cholerae NOT DETECTED NOT DETECTED Final   Enteroaggregative E coli (EAEC) NOT DETECTED NOT DETECTED Final   Enteropathogenic E coli (EPEC) NOT DETECTED NOT DETECTED Final   Enterotoxigenic E coli (ETEC) NOT DETECTED NOT DETECTED Final   Shiga like toxin producing E coli (STEC) NOT DETECTED NOT DETECTED Final   E. coli O157 NOT DETECTED NOT DETECTED Final   Shigella/Enteroinvasive E coli (EIEC) NOT DETECTED NOT DETECTED Final   Cryptosporidium NOT DETECTED NOT DETECTED Final   Cyclospora cayetanensis NOT DETECTED  NOT DETECTED Final   Entamoeba histolytica NOT DETECTED NOT DETECTED Final   Giardia lamblia NOT DETECTED NOT DETECTED Final   Adenovirus F40/41 NOT DETECTED NOT DETECTED Final   Astrovirus NOT DETECTED NOT DETECTED Final   Norovirus GI/GII NOT DETECTED NOT DETECTED Final   Rotavirus A NOT DETECTED NOT DETECTED Final   Sapovirus (I, II, IV, and V) NOT DETECTED NOT DETECTED Final    Scheduled Meds: . aspirin EC  81 mg Oral Daily  . atorvastatin  10 mg Oral Daily  . ceFEPime (MAXIPIME) IV  2 g Intravenous Q24H  . cloNIDine  0.2 mg Oral BID  . enoxaparin (LOVENOX) injection  40 mg Subcutaneous Q24H  . feeding supplement  1 Container Oral BID BM  . gabapentin  300 mg Oral BID  . hydrocortisone   Rectal QID  . insulin aspart  0-9 Units Subcutaneous TID WC  . multivitamin with minerals  1 tablet Oral Daily  . oxybutynin  10 mg Oral QHS  . rOPINIRole  0.25 mg Oral QHS  . tamsulosin  0.4 mg Oral Daily  . vancomycin  125 mg Oral QID   Continuous Infusions:

## 2015-08-17 DIAGNOSIS — R5381 Other malaise: Secondary | ICD-10-CM

## 2015-08-17 LAB — BASIC METABOLIC PANEL
ANION GAP: 11 (ref 5–15)
BUN: 10 mg/dL (ref 6–20)
CHLORIDE: 105 mmol/L (ref 101–111)
CO2: 23 mmol/L (ref 22–32)
Calcium: 8.6 mg/dL — ABNORMAL LOW (ref 8.9–10.3)
Creatinine, Ser: 1.07 mg/dL (ref 0.61–1.24)
GFR calc non Af Amer: 59 mL/min — ABNORMAL LOW (ref >60)
Glucose, Bld: 120 mg/dL — ABNORMAL HIGH (ref 65–99)
POTASSIUM: 3.7 mmol/L (ref 3.5–5.1)
SODIUM: 139 mmol/L (ref 135–145)

## 2015-08-17 LAB — CBC
HEMATOCRIT: 33.8 % — AB (ref 39.0–52.0)
HEMOGLOBIN: 10.8 g/dL — AB (ref 13.0–17.0)
MCH: 26.5 pg (ref 26.0–34.0)
MCHC: 32 g/dL (ref 30.0–36.0)
MCV: 82.8 fL (ref 78.0–100.0)
Platelets: 127 10*3/uL — ABNORMAL LOW (ref 150–400)
RBC: 4.08 MIL/uL — AB (ref 4.22–5.81)
RDW: 14.7 % (ref 11.5–15.5)
WBC: 5.9 10*3/uL (ref 4.0–10.5)

## 2015-08-17 LAB — GLUCOSE, CAPILLARY
GLUCOSE-CAPILLARY: 121 mg/dL — AB (ref 65–99)
GLUCOSE-CAPILLARY: 236 mg/dL — AB (ref 65–99)
GLUCOSE-CAPILLARY: 126 mg/dL — AB (ref 65–99)
GLUCOSE-CAPILLARY: 142 mg/dL — AB (ref 65–99)

## 2015-08-17 MED ORDER — HYDRALAZINE HCL 20 MG/ML IJ SOLN
10.0000 mg | Freq: Four times a day (QID) | INTRAMUSCULAR | Status: DC | PRN
  Administered 2015-08-17 – 2015-08-18 (×2): 10 mg via INTRAVENOUS
  Filled 2015-08-17 (×2): qty 1

## 2015-08-17 NOTE — Progress Notes (Addendum)
Patient ID: Mark Valenzuela, male   DOB: May 05, 1923, 82 y.o.   MRN: [id number]  [facility] PROGRESS NOTE  KHYLIL DEGNER [id number] DOB: 10-28-1922 DOA: 08/13/2015 PCP: [name], MD   Brief narrative:    82 y.o. Male with known hypertension, prostate cancer, diabetic neuropathy, recently hospitalized from 3/6 to 3/8 for Flu, discharged to [place], now presented to the ED for evaluation of progressively worsening watery diarrhea several days in duration, dysuria, hematuria.   In ED, he had low grade fever at 100.2 F, other vital signs were stable. CBC normal. CMET unremarkable  Assessment/Plan:    Active Problems:   Diarrhea - C. 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  Family Communication:  plan of care discussed with the patient, daughter and wife at bedside  Disposition Plan: Home by 08/18/2015  IV access:  Peripheral IV  Procedures and diagnostic studies:     Dg Chest 2 View 07/21/2015 Low lung volumes with generalized central airway thickening. No focal airspace disease or edema. 2. Suggested mild generalized osteosclerosis. In this patient with apparent prostate cancer, this could reflect metastatic disease. Correlation with serum PSA recommended.   Dg Chest Port 1 View 08/13/2015  No edema or consolidation.   Dg Abd Portable 2v 07/22/2015 Nonobstructive bowel gas pattern. Scattered air-fluid levels within the colon, nonspecific in the setting of  diarrhea. Severe osteoarthritis of both hips.   Medical Consultants:  None  Other Consultants:  None  IAnti-Infectives:   Rocephin 3/29 --> 4/1 Maxipime 4/1 --> Oral Vanc 3/30 -->  Faye Ramsay, MD  [facility] Pager [phone]  If 7PM-7AM, please contact night-coverage [email or web address] Password [email or web address] 08/17/2015, 10:50 AM   LOS: 4 days   HPI/Subjective: No events overnight. Reports feeling better, less diarrhea.   Objective: Filed Vitals:   08/16/15 0516 08/16/15 1417 08/16/15 2233 08/17/15 0530  BP: 145/64 158/74 183/85 162/80  Pulse: 63 71 66 63  Temp: 98.6 F (37 C) 98.6 F (37 C) 98.5 F (36.9 C) 98.1 F (36.7 C)  TempSrc:   Oral   Resp: 18 18 20 20   Height:      Weight:    72.5 kg (159 lb 13.3 oz)  SpO2: 99% 96% 100% 96%    Intake/Output Summary (Last 24 hours) at 08/17/15 1050 Last data filed at 08/17/15 1033  Gross per 24 hour  Intake    890 ml  Output   1000 ml  Net   -110 ml    Exam:   General:  Pt is alert, follows commands appropriately, not in acute distress  Cardiovascular: Regular rate and rhythm, no rubs, no gallops  Respiratory: Clear to auscultation bilaterally, no wheezing, no crackles, no rhonchi  Abdomen: Soft, non tender, non distended, bowel sounds present, no guarding  Data Reviewed: Basic Metabolic Panel:  Recent Labs Lab 08/13/15 1240 08/14/15 0644 08/15/15 0531 08/16/15  0554 08/17/15 0703  NA 138 142 141 140 139  K 4.3 3.3* 3.7 3.7 3.7  CL 105 110 110 109 105  CO2 22 22 23 24 23   GLUCOSE 113* 100* 141* 148* 120*  BUN 10 8 10 13 10   CREATININE 1.20 1.04 1.11 1.12 1.07  CALCIUM 9.0 8.2* 8.5* 8.4* 8.6*   Liver Function Tests:  Recent Labs Lab 08/14/15 0644  AST 22  ALT 15*  ALKPHOS 40  BILITOT 0.8  PROT 5.5*  ALBUMIN 2.2*   CBC:  Recent Labs Lab 08/13/15 1240 08/14/15 0445 08/15/15 0531 08/16/15 0554 08/17/15 0703  WBC 5.4 4.5 4.7 4.7 5.9  HGB 12.8* 10.8* 10.8* 10.6* 10.8*  HCT 37.2* 31.3* 32.4*  33.2* 33.8*  MCV 82.5 82.8 82.2 83.6 82.8  PLT 193 134* PLATELET CLUMPS NOTED ON SMEAR, COUNT APPEARS DECREASED 135* 127*   CBG:  Recent Labs Lab 08/16/15 0755 08/16/15 1149 08/16/15 1800 08/16/15 2239 08/17/15 0805  GLUCAP 147* 182* 159* 163* 121*    Recent Results (from the past 240 hour(s))  Urine culture     Status: None   Collection Time: 08/13/15  2:01 PM  Result Value Ref Range Status   Specimen Description URINE, RANDOM  Final   Special Requests NONE  Final   Culture >=100,000 COLONIES/mL PSEUDOMONAS AERUGINOSA  Final   Report Status 08/15/2015 FINAL  Final   Organism ID, Bacteria PSEUDOMONAS AERUGINOSA  Final      Susceptibility   Pseudomonas aeruginosa - MIC*    CEFTAZIDIME 4 SENSITIVE Sensitive     CIPROFLOXACIN <=0.25 SENSITIVE Sensitive     GENTAMICIN 2 SENSITIVE Sensitive     IMIPENEM 1 SENSITIVE Sensitive     PIP/TAZO 8 SENSITIVE Sensitive     CEFEPIME 4 SENSITIVE Sensitive     * >=100,000 COLONIES/mL PSEUDOMONAS AERUGINOSA  C difficile quick scan w PCR reflex     Status: Abnormal   Collection Time: 08/14/15  6:31 AM  Result Value Ref Range Status   C Diff antigen POSITIVE (A) NEGATIVE Final   C Diff toxin POSITIVE (A) NEGATIVE Final   C Diff interpretation Positive for toxigenic C. difficile  Final    Comment: CRITICAL RESULT CALLED TO, READ BACK BY AND VERIFIED WITH: S.JONES RN AT 0830 08/14/15 BY A.DAVIS   Gastrointestinal Panel by PCR , Stool     Status: None   Collection Time: 08/14/15  6:31 AM  Result Value Ref Range Status   Campylobacter species NOT DETECTED NOT DETECTED Final   Plesimonas shigelloides NOT DETECTED NOT DETECTED Final   Salmonella species NOT DETECTED NOT DETECTED Final   Yersinia enterocolitica NOT DETECTED NOT DETECTED Final   Vibrio species NOT DETECTED NOT DETECTED Final   Vibrio cholerae NOT DETECTED NOT DETECTED Final   Enteroaggregative E coli (EAEC) NOT DETECTED NOT DETECTED Final   Enteropathogenic E coli (EPEC) NOT  DETECTED NOT DETECTED Final   Enterotoxigenic E coli (ETEC) NOT DETECTED NOT DETECTED Final   Shiga like toxin producing E coli (STEC) NOT DETECTED NOT DETECTED Final   E. coli O157 NOT DETECTED NOT DETECTED Final   Shigella/Enteroinvasive E coli (EIEC) NOT DETECTED NOT DETECTED Final   Cryptosporidium NOT DETECTED NOT DETECTED Final   Cyclospora cayetanensis NOT DETECTED NOT DETECTED Final   Entamoeba histolytica NOT DETECTED NOT DETECTED Final   Giardia lamblia NOT DETECTED NOT DETECTED Final   Adenovirus F40/41 NOT DETECTED NOT DETECTED Final   Astrovirus NOT DETECTED NOT DETECTED Final   Norovirus GI/GII  NOT DETECTED NOT DETECTED Final   Rotavirus A NOT DETECTED NOT DETECTED Final   Sapovirus (I, II, IV, and V) NOT DETECTED NOT DETECTED Final    Scheduled Meds: . aspirin EC  81 mg Oral Daily  . atorvastatin  10 mg Oral Daily  . ceFEPime (MAXIPIME) IV  2 g Intravenous Q24H  . cloNIDine  0.2 mg Oral BID  . enoxaparin (LOVENOX) injection  40 mg Subcutaneous Q24H  . feeding supplement  1 Container Oral BID BM  . gabapentin  300 mg Oral BID  . hydrocortisone   Rectal QID  . insulin aspart  0-9 Units Subcutaneous TID WC  . multivitamin with minerals  1 tablet Oral Daily  . oxybutynin  10 mg Oral QHS  . rOPINIRole  0.25 mg Oral QHS  . tamsulosin  0.4 mg Oral Daily  . vancomycin  125 mg Oral QID   Continuous Infusions:

## 2015-08-18 LAB — CBC
HEMATOCRIT: 32.8 % — AB (ref 39.0–52.0)
HEMOGLOBIN: 11.1 g/dL — AB (ref 13.0–17.0)
MCH: 28.1 pg (ref 26.0–34.0)
MCHC: 33.8 g/dL (ref 30.0–36.0)
MCV: 83 fL (ref 78.0–100.0)
Platelets: 137 10*3/uL — ABNORMAL LOW (ref 150–400)
RBC: 3.95 MIL/uL — ABNORMAL LOW (ref 4.22–5.81)
RDW: 14.9 % (ref 11.5–15.5)
WBC: 6.2 10*3/uL (ref 4.0–10.5)

## 2015-08-18 LAB — BASIC METABOLIC PANEL
ANION GAP: 8 (ref 5–15)
BUN: 13 mg/dL (ref 6–20)
CALCIUM: 8.8 mg/dL — AB (ref 8.9–10.3)
CO2: 22 mmol/L (ref 22–32)
Chloride: 109 mmol/L (ref 101–111)
Creatinine, Ser: 1.08 mg/dL (ref 0.61–1.24)
GFR calc Af Amer: >60 mL/min (ref >60)
GFR calc non Af Amer: 58 mL/min — ABNORMAL LOW (ref >60)
GLUCOSE: 166 mg/dL — AB (ref 65–99)
POTASSIUM: 3.9 mmol/L (ref 3.5–5.1)
Sodium: 139 mmol/L (ref 135–145)

## 2015-08-18 LAB — GLUCOSE, CAPILLARY
GLUCOSE-CAPILLARY: 134 mg/dL — AB (ref 65–99)
GLUCOSE-CAPILLARY: 146 mg/dL — AB (ref 65–99)
Glucose-Capillary: 160 mg/dL — ABNORMAL HIGH (ref 65–99)
Glucose-Capillary: 259 mg/dL — ABNORMAL HIGH (ref 65–99)

## 2015-08-18 NOTE — Care Management Important Message (Signed)
Important Message  Patient Details  Name: ARVIE ALSEPT MRN: [id number] Date of Birth: [birth year]   Medicare Important Message Given:  Yes    Carles Collet, RN 08/18/2015, 11:15 AMImportant Message  Patient Details  Name: GREGORI OKELLEY MRN: [id number] Date of Birth: [birth year]   Medicare Important Message Given:  Yes    Carles Collet, RN 08/18/2015, 11:15 AM

## 2015-08-18 NOTE — Progress Notes (Signed)
Patient ID: Mark Valenzuela, male   DOB: [birth year], 82 y.o.   MRN: [id number]  [facility] PROGRESS NOTE  DEONTAYE FARVER [id number] DOB: 1923/01/05 DOA: 08/13/2015 PCP: [name], MD   Brief narrative:    82 y.o. Male with known hypertension, prostate cancer, diabetic neuropathy, recently hospitalized from 3/6 to 3/8 for Flu, discharged to [place], now presented to the ED for evaluation of progressively worsening watery diarrhea several days in duration, dysuria, hematuria.   In ED, he had low grade fever at 100.2 F, other vital signs were stable. CBC normal. CMET unremarkable  Assessment/Plan:    Active Problems:   Diarrhea - C. 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  Family Communication:  plan of care discussed with the patient, daughter and wife at bedside  Disposition Plan: Home by 08/19/2015  IV access:  Peripheral IV  Procedures and diagnostic studies:     Dg Chest 2 View 07/21/2015 Low lung volumes with generalized central airway thickening. No focal airspace disease or edema. 2. Suggested mild generalized osteosclerosis. In this patient with apparent prostate cancer, this could reflect metastatic disease. Correlation with serum PSA recommended.   Dg Chest Port 1 View 08/13/2015  No edema or consolidation.   Dg Abd Portable 2v 07/22/2015 Nonobstructive bowel gas pattern. Scattered air-fluid levels within the colon, nonspecific in  the setting of diarrhea. Severe osteoarthritis of both hips.   Medical Consultants:  None  Other Consultants:  None  IAnti-Infectives:   Rocephin 3/29 --> 4/1 Maxipime 4/1 --> Oral Vanc 3/30 -->  Faye Ramsay, MD  [facility] Pager [phone]  If 7PM-7AM, please contact night-coverage [email or web address] Password [email or web address] 08/18/2015, 5:13 PM   LOS: 5 days   HPI/Subjective: No events overnight. Reports feeling better, less diarrhea.   Objective: Filed Vitals:   08/18/15 0432 08/18/15 0434 08/18/15 0903 08/18/15 1400  BP:  134/67 155/80 160/70  Pulse:  74  73  Temp:  99 F (37.2 C)  98.5 F (36.9 C)  TempSrc:  Oral  Oral  Resp:  18  17  Height:      Weight: 75.9 kg (167 lb 5.3 oz)     SpO2:  98%  98%    Intake/Output Summary (Last 24 hours) at 08/18/15 1713 Last data filed at 08/18/15 1609  Gross per 24 hour  Intake    800 ml  Output   1825 ml  Net  -1025 ml    Exam:   General:  Pt is alert, follows commands appropriately, not in acute distress  Cardiovascular: Regular rate and rhythm, no rubs, no gallops  Respiratory: Clear to auscultation bilaterally, no wheezing, no crackles, no rhonchi  Abdomen: Soft, non tender, non distended, bowel sounds present, no guarding  Data Reviewed: Basic Metabolic Panel:  Recent Labs Lab 08/14/15 0644 08/15/15 0531 08/16/15 0554 08/17/15 0703 08/18/15 0737  NA  142 141 140 139 139  K 3.3* 3.7 3.7 3.7 3.9  CL 110 110 109 105 109  CO2 22 23 24 23 22   GLUCOSE 100* 141* 148* 120* 166*  BUN 8 10 13 10 13   CREATININE 1.04 1.11 1.12 1.07 1.08  CALCIUM 8.2* 8.5* 8.4* 8.6* 8.8*   Liver Function Tests:  Recent Labs Lab 08/14/15 0644  AST 22  ALT 15*  ALKPHOS 40  BILITOT 0.8  PROT 5.5*  ALBUMIN 2.2*   CBC:  Recent Labs Lab 08/14/15 0445 08/15/15 0531 08/16/15 0554 08/17/15 0703 08/18/15 0737  WBC 4.5 4.7 4.7 5.9 6.2  HGB 10.8* 10.8* 10.6* 10.8* 11.1*  HCT 31.3* 32.4* 33.2* 33.8* 32.8*  MCV 82.8 82.2 83.6 82.8  83.0  PLT 134* PLATELET CLUMPS NOTED ON SMEAR, COUNT APPEARS DECREASED 135* 127* 137*   CBG:  Recent Labs Lab 08/17/15 1207 08/17/15 1643 08/17/15 2119 08/18/15 0747 08/18/15 1212  GLUCAP 236* 126* 142* 160* 259*    Recent Results (from the past 240 hour(s))  Urine culture     Status: None   Collection Time: 08/13/15  2:01 PM  Result Value Ref Range Status   Specimen Description URINE, RANDOM  Final   Special Requests NONE  Final   Culture >=100,000 COLONIES/mL PSEUDOMONAS AERUGINOSA  Final   Report Status 08/15/2015 FINAL  Final   Organism ID, Bacteria PSEUDOMONAS AERUGINOSA  Final      Susceptibility   Pseudomonas aeruginosa - MIC*    CEFTAZIDIME 4 SENSITIVE Sensitive     CIPROFLOXACIN <=0.25 SENSITIVE Sensitive     GENTAMICIN 2 SENSITIVE Sensitive     IMIPENEM 1 SENSITIVE Sensitive     PIP/TAZO 8 SENSITIVE Sensitive     CEFEPIME 4 SENSITIVE Sensitive     * >=100,000 COLONIES/mL PSEUDOMONAS AERUGINOSA  C difficile quick scan w PCR reflex     Status: Abnormal   Collection Time: 08/14/15  6:31 AM  Result Value Ref Range Status   C Diff antigen POSITIVE (A) NEGATIVE Final   C Diff toxin POSITIVE (A) NEGATIVE Final   C Diff interpretation Positive for toxigenic C. difficile  Final    Comment: CRITICAL RESULT CALLED TO, READ BACK BY AND VERIFIED WITH: S.JONES RN AT 0830 08/14/15 BY A.DAVIS   Gastrointestinal Panel by PCR , Stool     Status: None   Collection Time: 08/14/15  6:31 AM  Result Value Ref Range Status   Campylobacter species NOT DETECTED NOT DETECTED Final   Plesimonas shigelloides NOT DETECTED NOT DETECTED Final   Salmonella species NOT DETECTED NOT DETECTED Final   Yersinia enterocolitica NOT DETECTED NOT DETECTED Final   Vibrio species NOT DETECTED NOT DETECTED Final   Vibrio cholerae NOT DETECTED NOT DETECTED Final   Enteroaggregative E coli (EAEC) NOT DETECTED NOT DETECTED Final   Enteropathogenic E coli (EPEC) NOT DETECTED NOT DETECTED Final    Enterotoxigenic E coli (ETEC) NOT DETECTED NOT DETECTED Final   Shiga like toxin producing E coli (STEC) NOT DETECTED NOT DETECTED Final   E. coli O157 NOT DETECTED NOT DETECTED Final   Shigella/Enteroinvasive E coli (EIEC) NOT DETECTED NOT DETECTED Final   Cryptosporidium NOT DETECTED NOT DETECTED Final   Cyclospora cayetanensis NOT DETECTED NOT DETECTED Final   Entamoeba histolytica NOT DETECTED NOT DETECTED Final   Giardia lamblia NOT DETECTED NOT DETECTED Final   Adenovirus F40/41 NOT DETECTED NOT DETECTED Final   Astrovirus NOT DETECTED NOT DETECTED Final   Norovirus GI/GII NOT DETECTED NOT DETECTED Final  Rotavirus A NOT DETECTED NOT DETECTED Final   Sapovirus (I, II, IV, and V) NOT DETECTED NOT DETECTED Final    Scheduled Meds: . aspirin EC  81 mg Oral Daily  . atorvastatin  10 mg Oral Daily  . ceFEPime (MAXIPIME) IV  2 g Intravenous Q24H  . cloNIDine  0.2 mg Oral BID  . enoxaparin (LOVENOX) injection  40 mg Subcutaneous Q24H  . feeding supplement  1 Container Oral BID BM  . gabapentin  300 mg Oral BID  . hydrocortisone   Rectal QID  . insulin aspart  0-9 Units Subcutaneous TID WC  . multivitamin with minerals  1 tablet Oral Daily  . oxybutynin  10 mg Oral QHS  . rOPINIRole  0.25 mg Oral QHS  . tamsulosin  0.4 mg Oral Daily  . vancomycin  125 mg Oral QID   Continuous Infusions:

## 2015-08-19 DIAGNOSIS — R197 Diarrhea, unspecified: Secondary | ICD-10-CM

## 2015-08-19 LAB — BASIC METABOLIC PANEL
Anion gap: 12 (ref 5–15)
BUN: 15 mg/dL (ref 6–20)
CHLORIDE: 103 mmol/L (ref 101–111)
CO2: 22 mmol/L (ref 22–32)
CREATININE: 1.11 mg/dL (ref 0.61–1.24)
Calcium: 8.9 mg/dL (ref 8.9–10.3)
GFR calc Af Amer: >60 mL/min (ref >60)
GFR calc non Af Amer: 56 mL/min — ABNORMAL LOW (ref >60)
GLUCOSE: 159 mg/dL — AB (ref 65–99)
POTASSIUM: 4.1 mmol/L (ref 3.5–5.1)
SODIUM: 137 mmol/L (ref 135–145)

## 2015-08-19 LAB — CBC
HEMATOCRIT: 34.1 % — AB (ref 39.0–52.0)
HEMOGLOBIN: 11 g/dL — AB (ref 13.0–17.0)
MCH: 26.8 pg (ref 26.0–34.0)
MCHC: 32.3 g/dL (ref 30.0–36.0)
MCV: 83 fL (ref 78.0–100.0)
Platelets: 129 10*3/uL — ABNORMAL LOW (ref 150–400)
RBC: 4.11 MIL/uL — AB (ref 4.22–5.81)
RDW: 15 % (ref 11.5–15.5)
WBC: 7.4 10*3/uL (ref 4.0–10.5)

## 2015-08-19 LAB — GLUCOSE, CAPILLARY
GLUCOSE-CAPILLARY: 140 mg/dL — AB (ref 65–99)
Glucose-Capillary: 166 mg/dL — ABNORMAL HIGH (ref 65–99)

## 2015-08-19 MED ORDER — VANCOMYCIN 50 MG/ML ORAL SOLUTION: 125.0000 mg | Freq: Four times a day (QID) | ORAL | Status: DC

## 2015-08-19 MED ORDER — HYDROCODONE-ACETAMINOPHEN 5-325 MG PO TABS: ORAL_TABLET | ORAL | Status: DC

## 2015-08-19 MED ORDER — HYDROCORTISONE 2.5 % RE CREA: TOPICAL_CREAM | Freq: Four times a day (QID) | RECTAL | Status: DC

## 2015-08-19 MED ORDER — TRAZODONE HCL 50 MG PO TABS: 25.0000 mg | ORAL_TABLET | Freq: Every evening | ORAL | Status: DC | PRN

## 2015-08-19 MED ORDER — CIPROFLOXACIN HCL 500 MG PO TABS: 500.0000 mg | ORAL_TABLET | Freq: Two times a day (BID) | ORAL | Status: DC

## 2015-08-19 NOTE — Discharge Summary (Signed)
Physician Discharge Summary  Mark Valenzuela [id number] DOB: [birth year] DOA: 08/13/2015  PCP: [name], MD  Admit date: 08/13/2015 Discharge date: 08/19/2015  Recommendations for Outpatient Follow-up:  1. Pt will need to follow up with PCP in 2-3 weeks post discharge 2. Please obtain BMP to evaluate electrolytes and kidney function 3. Please also check CBC to evaluate Hg and Hct levels 4. Must complete oral ABX therapy Cipro for Pseudomonas UTI and oral vanc for C. Diff as outlined below   Discharge Diagnoses:  Active Problems:   Hypertension   Diabetes mellitus type 2 in nonobese (HCC)   BPH (benign prostatic hyperplasia)   UTI (lower urinary tract infection)   Fever   Diarrhea   Hyperlipidemia   Physical deconditioning   Pressure ulcer    Discharge Condition: Stable  Diet recommendation: Heart healthy diet discussed in details     Brief narrative:    82 y.o. Male with known hypertension, prostate cancer, diabetic neuropathy, recently hospitalized from 3/6 to 3/8 for Flu, discharged to [place], now presented to the ED for evaluation of progressively worsening watery diarrhea several days in duration, dysuria, hematuria.   In ED, he had low grade fever at 100.2 F, other vital signs were stable. CBC normal. CMET unremarkable  Assessment/Plan:    Active Problems:  Diarrhea - C. Diff +, continue oral vanc upon discharge to complete therapy  - diarrhea resolved    UTI - placed on Rocephin but urine culture positive for pseudomonas, changed ABX to Maxipime 4/1 - since d/c today, change to oral Cipro to complete therapy    Essential HTN - reasonable inpatient control    Thrombocytopenia - mild, reactive   Hypokalemia - supplemented and WNL this AM   DM type II with complications of neuropathy - continue home medical regimen   Code Status: Full.  Family Communication: plan of care discussed with the patient, daughter and wife at bedside   Disposition Plan: Home   IV access:  Peripheral IV  Procedures and diagnostic studies:    Dg Chest 2 View 07/21/2015 Low lung volumes with generalized central airway thickening. No focal airspace disease or edema. 2. Suggested mild generalized osteosclerosis. In this patient with apparent prostate cancer, this could reflect metastatic disease. Correlation with serum PSA recommended.   Dg Chest Port 1 View 08/13/2015 No edema or consolidation.   Dg Abd Portable 2v 07/22/2015 Nonobstructive bowel gas pattern. Scattered air-fluid levels within the colon, nonspecific in the setting of diarrhea. Severe osteoarthritis of both hips.   Medical Consultants:  None  Other Consultants:  None  IAnti-Infectives:   Rocephin 3/29 --> 4/1 Maxipime 4/1 --> 4/4 Cipro 4/4 --> Oral Vanc 3/30 -->  Faye Ramsay, MD [facility] Pager [phone]       Discharge Exam: Filed Vitals:   08/18/15 2351 08/19/15 0456  BP: 161/84 133/65  Pulse: 72 74  Temp:  98.5 F (36.9 C)  Resp:  18   Filed Vitals:   08/18/15 2127 08/18/15 2351 08/19/15 0409 08/19/15 0456  BP: 184/88 161/84  133/65  Pulse: 89 72  74  Temp: 98.2 F (36.8 C)   98.5 F (36.9 C)  TempSrc:      Resp: 18   18  Height:      Weight:   76.7 kg (169 lb 1.5 oz)   SpO2: 94% 95%  97%    General: Pt is alert, follows commands appropriately, not in acute distress Cardiovascular: Regular rate and rhythm, S1/S2 +, no  murmurs, no rubs, no gallops Respiratory: Clear to auscultation bilaterally, no wheezing, no crackles, no rhonchi Abdominal: Soft, non tender, non distended, bowel sounds +, no guarding Extremities: no edema, no cyanosis, pulses palpable bilaterally DP and PT Neuro: Grossly nonfocal  Discharge Instructions  Discharge Instructions    Diet - low sodium heart healthy    Complete by:  As directed      Increase activity slowly    Complete by:  As directed             Medication List     TAKE these medications        albuterol (2.5 MG/3ML) 0.083% nebulizer solution  Commonly known as:  PROVENTIL  Take 3 mLs (2.5 mg total) by nebulization every 4 (four) hours as needed for wheezing (stop after flu resolves).     aspirin EC 81 MG tablet  Take 81 mg by mouth daily.     atorvastatin 10 MG tablet  Commonly known as:  LIPITOR  Take 10 mg by mouth daily.     ciprofloxacin 500 MG tablet  Commonly known as:  CIPRO  Take 1 tablet (500 mg total) by mouth 2 (two) times daily.     cloNIDine 0.2 MG tablet  Commonly known as:  CATAPRES  Take 0.2 mg by mouth 2 (two) times daily.     gabapentin 300 MG capsule  Commonly known as:  NEURONTIN  Take 300 mg by mouth 2 (two) times daily.     glyBURIDE 5 MG tablet  Commonly known as:  DIABETA  Take 5 mg by mouth daily with breakfast.     HYDROcodone-acetaminophen 5-325 MG tablet  Commonly known as:  NORCO/VICODIN  1 by mouth twice daily for pain DO NOT EXCEED 4GM OF APAP IN 24 HOURS FROM ALL SOURCES     hydrocortisone 2.5 % rectal cream  Commonly known as:  ANUSOL-HC  Place rectally 4 (four) times daily.     oxybutynin 5 MG tablet  Commonly known as:  DITROPAN  Take 10 mg by mouth at bedtime.     rOPINIRole 0.25 MG tablet  Commonly known as:  REQUIP  Take 0.25 mg by mouth at bedtime.     simethicone 40 MG/0.6ML drops  Commonly known as:  MYLICON  Take 1.2 mLs (80 mg total) by mouth 4 (four) times daily as needed for flatulence.     tamsulosin 0.4 MG Caps capsule  Commonly known as:  FLOMAX  Take 0.4 mg by mouth daily.     traZODone 50 MG tablet  Commonly known as:  DESYREL  Take 0.5 tablets (25 mg total) by mouth at bedtime as needed for sleep.     TYLENOL 500 MG tablet  Generic drug:  acetaminophen  Take 500 mg by mouth every 6 (six) hours as needed for mild pain.     vancomycin 50 mg/mL oral solution  Commonly known as:  VANCOCIN  Take 2.5 mLs (125 mg total) by mouth 4 (four) times daily. Continue taking  until 08/29/2015           Follow-up Information    Follow up with [facility].   Why:  to resume home health services you had prior to admission   Contact information:   [address] [place] [address] [phone]       Follow up with [name], MD.   Specialty:  Internal Medicine   Contact information:   [address] [employer]  [place] [address] [phone]       Call Faye Ramsay, MD.   Specialty:  Internal Medicine   Why:  As needed call my cell [phone]   Contact information:   [address] [place] [address] [phone]        The results of significant diagnostics from this hospitalization (including imaging, microbiology, ancillary and laboratory) are listed below for reference.     Microbiology: Recent Results (from the past 240 hour(s))  Urine culture     Status: None   Collection Time: 08/13/15  2:01 PM  Result Value Ref Range Status   Specimen Description URINE, RANDOM  Final   Special Requests NONE  Final   Culture >=100,000 COLONIES/mL PSEUDOMONAS AERUGINOSA  Final   Report Status 08/15/2015 FINAL  Final   Organism ID, Bacteria PSEUDOMONAS AERUGINOSA  Final      Susceptibility   Pseudomonas aeruginosa - MIC*    CEFTAZIDIME 4 SENSITIVE Sensitive     CIPROFLOXACIN <=0.25 SENSITIVE Sensitive     GENTAMICIN 2 SENSITIVE Sensitive     IMIPENEM 1 SENSITIVE Sensitive     PIP/TAZO 8 SENSITIVE Sensitive     CEFEPIME 4 SENSITIVE Sensitive     * >=100,000 COLONIES/mL PSEUDOMONAS AERUGINOSA  C difficile quick scan w PCR reflex     Status: Abnormal   Collection Time: 08/14/15  6:31 AM  Result Value Ref Range Status   C Diff antigen POSITIVE (A) NEGATIVE Final   C Diff toxin POSITIVE (A) NEGATIVE Final   C Diff interpretation Positive for toxigenic C. difficile  Final    Comment: CRITICAL RESULT CALLED TO, READ BACK BY AND VERIFIED WITH: S.JONES RN AT 0830 08/14/15 BY A.DAVIS    Gastrointestinal Panel by PCR , Stool     Status: None   Collection Time: 08/14/15  6:31 AM  Result Value Ref Range Status   Campylobacter species NOT DETECTED NOT DETECTED Final   Plesimonas shigelloides NOT DETECTED NOT DETECTED Final   Salmonella species NOT DETECTED NOT DETECTED Final   Yersinia enterocolitica NOT DETECTED NOT DETECTED Final   Vibrio species NOT DETECTED NOT DETECTED Final   Vibrio cholerae NOT DETECTED NOT DETECTED Final   Enteroaggregative E coli (EAEC) NOT DETECTED NOT DETECTED Final   Enteropathogenic E coli (EPEC) NOT DETECTED NOT DETECTED Final   Enterotoxigenic E coli (ETEC) NOT DETECTED NOT DETECTED Final   Shiga like toxin producing E coli (STEC) NOT DETECTED NOT DETECTED Final   E. coli O157 NOT DETECTED NOT DETECTED Final   Shigella/Enteroinvasive E coli (EIEC) NOT DETECTED NOT DETECTED Final   Cryptosporidium NOT DETECTED NOT DETECTED Final   Cyclospora cayetanensis NOT DETECTED NOT DETECTED Final   Entamoeba histolytica NOT DETECTED NOT DETECTED Final   Giardia lamblia NOT DETECTED NOT DETECTED Final   Adenovirus F40/41 NOT DETECTED NOT DETECTED Final   Astrovirus NOT DETECTED NOT DETECTED Final   Norovirus GI/GII NOT DETECTED NOT DETECTED Final   Rotavirus A NOT DETECTED NOT DETECTED Final   Sapovirus (I, II, IV, and V) NOT DETECTED NOT DETECTED Final     Labs: Basic Metabolic Panel:  Recent Labs Lab 08/15/15 0531 08/16/15 0554 08/17/15 0703 08/18/15 0737 08/19/15 0419  NA 141 140 139 139 137  K 3.7 3.7 3.7 3.9 4.1  CL 110 109 105 109 103  CO2 23 24 23 22 22   GLUCOSE 141* 148* 120* 166* 159*  BUN 10 13 10 13 15   CREATININE 1.11 1.12 1.07 1.08 1.11  CALCIUM 8.5* 8.4* 8.6* 8.8* 8.9   Liver Function Tests:  Recent Labs Lab 08/14/15 0644  AST 22  ALT 15*  ALKPHOS 40  BILITOT 0.8  PROT 5.5*  ALBUMIN 2.2*   No results for input(s): LIPASE, AMYLASE in the last 168 hours. No results for input(s): AMMONIA in the last 168  hours. CBC:  Recent Labs Lab 08/15/15 0531 08/16/15 0554 08/17/15 0703 08/18/15 0737 08/19/15 0419  WBC 4.7 4.7 5.9 6.2 7.4  HGB 10.8* 10.6* 10.8* 11.1* 11.0*  HCT 32.4* 33.2* 33.8* 32.8* 34.1*  MCV 82.2 83.6 82.8 83.0 83.0  PLT PLATELET CLUMPS NOTED ON SMEAR, COUNT APPEARS DECREASED 135* 127* 137* 129*   Cardiac Enzymes: No results for input(s): CKTOTAL, CKMB, CKMBINDEX, TROPONINI in the last 168 hours. BNP: BNP (last 3 results) No results for input(s): BNP in the last 8760 hours.  ProBNP (last 3 results) No results for input(s): PROBNP in the last 8760 hours.  CBG:  Recent Labs Lab 08/18/15 0747 08/18/15 1212 08/18/15 1714 08/18/15 2124 08/19/15 0751  GLUCAP 160* 259* 134* 146* 140*     SIGNED: Time coordinating discharge: 30 minutes  MAGICK-Taitum Alms, MD  [facility] 08/19/2015, 11:47 AM Pager [phone]  If 7PM-7AM, please contact night-coverage [email or web address] Password [email or web address]

## 2015-08-19 NOTE — Discharge Instructions (Signed)
Clostridium Difficile Infection Clostridium difficile (C. difficile or C. diff) is a bacterium normally found in the intestinal tract or colon. C. difficile infection causes diarrhea and sometimes a severe disease called pseudomembranous colitis (C. difficile colitis). C. difficile colitis can damage the lining of the colon or cause the colon to become very large (toxic megacolon). Older adults and people with certain medical conditions have a greater risk of getting C. difficile infections. CAUSES The balance of bacteria in your colon can change when you are sick, especially when taking antibiotic medicine. Taking antibiotics may allow the C. difficile to grow, multiply, and make a toxin that causes C. difficile infection.  SYMPTOMS  Diarrhea.  Fever.  Fatigue.  Loss of appetite.  Nausea.  Abdominal swelling, pain, or tenderness.  Dehydration. DIAGNOSIS Your health care provider may suspect C. difficile infection based on your symptoms and if you have taken antibiotics recently. Your health care provider may also order:  A lab test that can detect the toxin in your stool.  A sigmoidoscopy or colonoscopy to look at the appearance of your colon. These procedures involve passing an instrument through your rectum to look at the inside of your colon. Your health care provider will help determine if these tests are necessary. TREATMENT Treatment may include:  Taking antibiotics that keep C. difficile from growing.  Stopping the antibiotics you were on before the C. difficile infection began. Only do this if instructed to do so by your health care provider.  IV fluids and correction of electrolyte imbalance.  Surgery to remove the infected part of the intestines. This is rare. HOME CARE INSTRUCTIONS  Drink enough fluids to keep your urine clear or pale yellow. Avoid milk, caffeine, and alcohol.  Ask your health care provider for specific rehydration instructions.  Eat small,  frequent meals rather than large meals.  Take your antibiotics as directed. Finish them even if you start to feel better.  Do not use medicines to slow diarrhea. This could delay healing or cause problems.  Wash your hands thoroughly after using the bathroom and before preparing food. Make sure people who live with you wash their hands often, too.  Clean all surfaces with a product that contains chlorine bleach. SEEK MEDICAL CARE IF:  Your diarrhea lasts longer than expected or comes back after you finish your antibiotic medicine for the C. difficile infection.  You have trouble staying hydrated.  You have a fever. SEEK IMMEDIATE MEDICAL CARE IF:  You have increasing abdominal pain or tenderness.  You have blood in your stools, or your stools look dark black and tarry.  You cannot eat or drink without vomiting.   This information is not intended to replace advice given to you by your health care provider. Make sure you discuss any questions you have with your health care provider.   Document Released: 02/10/2005 Document Revised: 05/24/2014 Document Reviewed: 11/04/2014 Elsevier Interactive Patient Education 2016 [employer]  

## 2015-08-19 NOTE — Progress Notes (Signed)
NURSING PROGRESS NOTE  Mark Valenzuela [id number] Discharge Data: 08/19/2015 7:56 PM Attending Provider: No att. providers found [id number], MD     Audree Bane to be D/C'd Home per MD order.  Discussed with the patient the After Visit Summary and all questions fully answered. All IV's discontinued with no bleeding noted. All belongings returned to patient for patient to take home.   Last Vital Signs:  Blood pressure 133/65, pulse 74, temperature 98.5 F (36.9 C), temperature source Oral, resp. rate 18, height 5\' 7"  (1.702 m), weight 76.7 kg (169 lb 1.5 oz), SpO2 97 %.  Discharge Medication List   Medication List    TAKE these medications        albuterol (2.5 MG/3ML) 0.083% nebulizer solution  Commonly known as:  PROVENTIL  Take 3 mLs (2.5 mg total) by nebulization every 4 (four) hours as needed for wheezing (stop after flu resolves).     aspirin EC 81 MG tablet  Take 81 mg by mouth daily.     atorvastatin 10 MG tablet  Commonly known as:  LIPITOR  Take 10 mg by mouth daily.     ciprofloxacin 500 MG tablet  Commonly known as:  CIPRO  Take 1 tablet (500 mg total) by mouth 2 (two) times daily.     cloNIDine 0.2 MG tablet  Commonly known as:  CATAPRES  Take 0.2 mg by mouth 2 (two) times daily.     gabapentin 300 MG capsule  Commonly known as:  NEURONTIN  Take 300 mg by mouth 2 (two) times daily.     glyBURIDE 5 MG tablet  Commonly known as:  DIABETA  Take 5 mg by mouth daily with breakfast.     HYDROcodone-acetaminophen 5-325 MG tablet  Commonly known as:  NORCO/VICODIN  1 by mouth twice daily for pain DO NOT EXCEED 4GM OF APAP IN 24 HOURS FROM ALL SOURCES     hydrocortisone 2.5 % rectal cream  Commonly known as:  ANUSOL-HC  Place rectally 4 (four) times daily.     oxybutynin 5 MG tablet  Commonly known as:  DITROPAN  Take 10 mg by mouth at bedtime.     rOPINIRole 0.25 MG tablet  Commonly known as:  REQUIP  Take 0.25 mg by mouth at bedtime.     simethicone 40 MG/0.6ML drops  Commonly known as:  MYLICON  Take 1.2 mLs (80 mg total) by mouth 4 (four) times daily as needed for flatulence.     tamsulosin 0.4 MG Caps capsule  Commonly known as:  FLOMAX  Take 0.4 mg by mouth daily.     traZODone 50 MG tablet  Commonly known as:  DESYREL  Take 0.5 tablets (25 mg total) by mouth at bedtime as needed for sleep.     TYLENOL 500 MG tablet  Generic drug:  acetaminophen  Take 500 mg by mouth every 6 (six) hours as needed for mild pain.     vancomycin 50 mg/mL oral solution  Commonly known as:  VANCOCIN  Take 2.5 mLs (125 mg total) by mouth 4 (four) times daily. Continue taking until 08/29/2015

## 2015-08-19 NOTE — Progress Notes (Signed)
Nutrition Follow-up  DOCUMENTATION CODES:   Not applicable  INTERVENTION:   -Continue Boost Breeze po BID, each supplement provides 250 kcal and 9 grams of protein -Continue MVI daily  NUTRITION DIAGNOSIS:   Biting/chewing difficulty related to  (poor dentition) as evidenced by per patient/family report.  Progressing  GOAL:   Patient will meet greater than or equal to 90% of their needs  Progressing  MONITOR:   PO intake, Labs, Weight trends, Skin, I & O's  REASON FOR ASSESSMENT:   Consult Poor PO  ASSESSMENT:   Mark Valenzuela is a 82 y.o. male With a history of hypertension, prostate cancer, diabetic neuropathy, recently hospitalized from 3/6 to 3/8 for Flu discharged to [place], now presenting to the ED as instructed by his PCP, with diarrhea since last Saturday. He reports worst symptoms present on Sunday, but not improved since. He reports his stools to be liquid,no blood was visible. He reports generalized weakness, fatigue, poor oral intake, subjective fevers and chills. He denies shortness of breath but does report light green cough. He denies any chest pain or palpitations. He also reports dysuria, without gross hematuria.   Spoke with pt, who was sitting in recliner at time of visit. He is in very good spirits today. He reports his appetite has improved since last visit. Noted 45-100% meal completion. He reports he ordered fish for dinner as he requires "soft foods" due to multiple missing teeth. Pt reports that he has not have any difficulty chewing foods that have been served to him during the hospitalization.   Pt reports he has been consuming Boost Breeze supplements and "likes all of the flavors". Discussed importance of good meal and supplement intake to promote healing. Pt with no further nutritional questions or concerns at this time.   Labs reviewed: CBGS: 134-166.  Diet Order:  Diet regular Room service appropriate?: Yes; Fluid consistency::  Thin  Skin:  Wound (see comment) (DTI rt and lt heels)  Last BM:  08/13/15  Height:   Ht Readings from Last 1 Encounters:  08/13/15 5\' 7"  (1.702 m)    Weight:   Wt Readings from Last 1 Encounters:  08/19/15 169 lb 1.5 oz (76.7 kg)    Ideal Body Weight:  67.3 kg  BMI:  Body mass index is 26.48 kg/(m^2).  Estimated Nutritional Needs:   Kcal:  1800-2000  Protein:  85-100 grams  Fluid:  1.8-2.0 L  EDUCATION NEEDS:   Education needs addressed  Shaft Corigliano A. Jimmye Norman, RD, LDN, CDE Pager: [phone] After hours Pager: [phone]

## 2015-08-22 DIAGNOSIS — R269 Unspecified abnormalities of gait and mobility: Secondary | ICD-10-CM | POA: Diagnosis not present

## 2015-08-22 DIAGNOSIS — G629 Polyneuropathy, unspecified: Secondary | ICD-10-CM | POA: Diagnosis not present

## 2015-08-22 DIAGNOSIS — E0840 Diabetes mellitus due to underlying condition with diabetic neuropathy, unspecified: Secondary | ICD-10-CM | POA: Diagnosis not present

## 2015-08-22 DIAGNOSIS — I10 Essential (primary) hypertension: Secondary | ICD-10-CM | POA: Diagnosis not present

## 2015-08-22 DIAGNOSIS — Z7984 Long term (current) use of oral hypoglycemic drugs: Secondary | ICD-10-CM | POA: Diagnosis not present

## 2015-08-22 DIAGNOSIS — E1151 Type 2 diabetes mellitus with diabetic peripheral angiopathy without gangrene: Secondary | ICD-10-CM | POA: Diagnosis not present

## 2015-08-22 DIAGNOSIS — N390 Urinary tract infection, site not specified: Secondary | ICD-10-CM | POA: Diagnosis not present

## 2015-08-22 DIAGNOSIS — A047 Enterocolitis due to Clostridium difficile: Secondary | ICD-10-CM | POA: Diagnosis not present

## 2015-08-22 DIAGNOSIS — I739 Peripheral vascular disease, unspecified: Secondary | ICD-10-CM | POA: Diagnosis not present

## 2015-08-26 DIAGNOSIS — E119 Type 2 diabetes mellitus without complications: Secondary | ICD-10-CM | POA: Diagnosis not present

## 2015-09-01 DIAGNOSIS — R351 Nocturia: Secondary | ICD-10-CM | POA: Diagnosis not present

## 2015-09-01 DIAGNOSIS — R3915 Urgency of urination: Secondary | ICD-10-CM | POA: Diagnosis not present

## 2015-09-01 DIAGNOSIS — Z8546 Personal history of malignant neoplasm of prostate: Secondary | ICD-10-CM | POA: Diagnosis not present

## 2015-09-09 DIAGNOSIS — M6281 Muscle weakness (generalized): Secondary | ICD-10-CM | POA: Diagnosis not present

## 2015-09-09 DIAGNOSIS — R269 Unspecified abnormalities of gait and mobility: Secondary | ICD-10-CM | POA: Diagnosis not present

## 2015-09-09 DIAGNOSIS — M150 Primary generalized (osteo)arthritis: Secondary | ICD-10-CM | POA: Diagnosis not present

## 2015-10-06 DIAGNOSIS — Z7984 Long term (current) use of oral hypoglycemic drugs: Secondary | ICD-10-CM | POA: Diagnosis not present

## 2015-10-06 DIAGNOSIS — I739 Peripheral vascular disease, unspecified: Secondary | ICD-10-CM | POA: Diagnosis not present

## 2015-10-06 DIAGNOSIS — L89612 Pressure ulcer of right heel, stage 2: Secondary | ICD-10-CM | POA: Diagnosis not present

## 2015-10-06 DIAGNOSIS — L89609 Pressure ulcer of unspecified heel, unspecified stage: Secondary | ICD-10-CM | POA: Diagnosis not present

## 2015-10-06 DIAGNOSIS — R269 Unspecified abnormalities of gait and mobility: Secondary | ICD-10-CM | POA: Diagnosis not present

## 2015-10-06 DIAGNOSIS — N183 Chronic kidney disease, stage 3 (moderate): Secondary | ICD-10-CM | POA: Diagnosis not present

## 2015-10-06 DIAGNOSIS — E1151 Type 2 diabetes mellitus with diabetic peripheral angiopathy without gangrene: Secondary | ICD-10-CM | POA: Diagnosis not present

## 2015-10-06 DIAGNOSIS — E0840 Diabetes mellitus due to underlying condition with diabetic neuropathy, unspecified: Secondary | ICD-10-CM | POA: Diagnosis not present

## 2015-10-06 DIAGNOSIS — Z7189 Other specified counseling: Secondary | ICD-10-CM | POA: Diagnosis not present

## 2015-10-09 DIAGNOSIS — R269 Unspecified abnormalities of gait and mobility: Secondary | ICD-10-CM | POA: Diagnosis not present

## 2015-10-09 DIAGNOSIS — M6281 Muscle weakness (generalized): Secondary | ICD-10-CM | POA: Diagnosis not present

## 2015-10-09 DIAGNOSIS — M150 Primary generalized (osteo)arthritis: Secondary | ICD-10-CM | POA: Diagnosis not present

## 2015-10-10 DIAGNOSIS — C61 Malignant neoplasm of prostate: Secondary | ICD-10-CM | POA: Diagnosis not present

## 2015-10-10 DIAGNOSIS — L89612 Pressure ulcer of right heel, stage 2: Secondary | ICD-10-CM | POA: Diagnosis not present

## 2015-10-10 DIAGNOSIS — L89622 Pressure ulcer of left heel, stage 2: Secondary | ICD-10-CM | POA: Diagnosis not present

## 2015-10-10 DIAGNOSIS — M25562 Pain in left knee: Secondary | ICD-10-CM | POA: Diagnosis not present

## 2015-10-10 DIAGNOSIS — E1140 Type 2 diabetes mellitus with diabetic neuropathy, unspecified: Secondary | ICD-10-CM | POA: Diagnosis not present

## 2015-10-10 DIAGNOSIS — I10 Essential (primary) hypertension: Secondary | ICD-10-CM | POA: Diagnosis not present

## 2015-10-15 DIAGNOSIS — E119 Type 2 diabetes mellitus without complications: Secondary | ICD-10-CM | POA: Diagnosis not present

## 2015-11-09 DIAGNOSIS — R269 Unspecified abnormalities of gait and mobility: Secondary | ICD-10-CM | POA: Diagnosis not present

## 2015-11-09 DIAGNOSIS — M150 Primary generalized (osteo)arthritis: Secondary | ICD-10-CM | POA: Diagnosis not present

## 2015-11-09 DIAGNOSIS — M6281 Muscle weakness (generalized): Secondary | ICD-10-CM | POA: Diagnosis not present

## 2015-12-08 DIAGNOSIS — L89612 Pressure ulcer of right heel, stage 2: Secondary | ICD-10-CM | POA: Diagnosis not present

## 2015-12-08 DIAGNOSIS — L89622 Pressure ulcer of left heel, stage 2: Secondary | ICD-10-CM | POA: Diagnosis not present

## 2015-12-08 DIAGNOSIS — E1140 Type 2 diabetes mellitus with diabetic neuropathy, unspecified: Secondary | ICD-10-CM | POA: Diagnosis not present

## 2015-12-08 DIAGNOSIS — I10 Essential (primary) hypertension: Secondary | ICD-10-CM | POA: Diagnosis not present

## 2015-12-08 DIAGNOSIS — Z4800 Encounter for change or removal of nonsurgical wound dressing: Secondary | ICD-10-CM | POA: Diagnosis not present

## 2015-12-08 DIAGNOSIS — C61 Malignant neoplasm of prostate: Secondary | ICD-10-CM | POA: Diagnosis not present

## 2015-12-09 DIAGNOSIS — R269 Unspecified abnormalities of gait and mobility: Secondary | ICD-10-CM | POA: Diagnosis not present

## 2015-12-09 DIAGNOSIS — M150 Primary generalized (osteo)arthritis: Secondary | ICD-10-CM | POA: Diagnosis not present

## 2015-12-09 DIAGNOSIS — M6281 Muscle weakness (generalized): Secondary | ICD-10-CM | POA: Diagnosis not present

## 2016-01-09 DIAGNOSIS — M6281 Muscle weakness (generalized): Secondary | ICD-10-CM | POA: Diagnosis not present

## 2016-01-09 DIAGNOSIS — R269 Unspecified abnormalities of gait and mobility: Secondary | ICD-10-CM | POA: Diagnosis not present

## 2016-01-09 DIAGNOSIS — M150 Primary generalized (osteo)arthritis: Secondary | ICD-10-CM | POA: Diagnosis not present

## 2016-02-05 DIAGNOSIS — N183 Chronic kidney disease, stage 3 (moderate): Secondary | ICD-10-CM | POA: Diagnosis not present

## 2016-02-05 DIAGNOSIS — L989 Disorder of the skin and subcutaneous tissue, unspecified: Secondary | ICD-10-CM | POA: Diagnosis not present

## 2016-02-05 DIAGNOSIS — G629 Polyneuropathy, unspecified: Secondary | ICD-10-CM | POA: Diagnosis not present

## 2016-02-05 DIAGNOSIS — I10 Essential (primary) hypertension: Secondary | ICD-10-CM | POA: Diagnosis not present

## 2016-02-05 DIAGNOSIS — E1122 Type 2 diabetes mellitus with diabetic chronic kidney disease: Secondary | ICD-10-CM | POA: Diagnosis not present

## 2016-02-05 DIAGNOSIS — E7800 Pure hypercholesterolemia, unspecified: Secondary | ICD-10-CM | POA: Diagnosis not present

## 2016-02-05 DIAGNOSIS — L89612 Pressure ulcer of right heel, stage 2: Secondary | ICD-10-CM | POA: Diagnosis not present

## 2016-02-05 DIAGNOSIS — E1151 Type 2 diabetes mellitus with diabetic peripheral angiopathy without gangrene: Secondary | ICD-10-CM | POA: Diagnosis not present

## 2016-02-05 DIAGNOSIS — I739 Peripheral vascular disease, unspecified: Secondary | ICD-10-CM | POA: Diagnosis not present

## 2016-02-05 DIAGNOSIS — E0840 Diabetes mellitus due to underlying condition with diabetic neuropathy, unspecified: Secondary | ICD-10-CM | POA: Diagnosis not present

## 2016-02-06 DIAGNOSIS — C61 Malignant neoplasm of prostate: Secondary | ICD-10-CM | POA: Diagnosis not present

## 2016-02-06 DIAGNOSIS — Z4800 Encounter for change or removal of nonsurgical wound dressing: Secondary | ICD-10-CM | POA: Diagnosis not present

## 2016-02-06 DIAGNOSIS — I10 Essential (primary) hypertension: Secondary | ICD-10-CM | POA: Diagnosis not present

## 2016-02-06 DIAGNOSIS — E1140 Type 2 diabetes mellitus with diabetic neuropathy, unspecified: Secondary | ICD-10-CM | POA: Diagnosis not present

## 2016-02-06 DIAGNOSIS — L89612 Pressure ulcer of right heel, stage 2: Secondary | ICD-10-CM | POA: Diagnosis not present

## 2016-02-06 DIAGNOSIS — Z7982 Long term (current) use of aspirin: Secondary | ICD-10-CM | POA: Diagnosis not present

## 2016-02-09 DIAGNOSIS — R269 Unspecified abnormalities of gait and mobility: Secondary | ICD-10-CM | POA: Diagnosis not present

## 2016-02-09 DIAGNOSIS — M150 Primary generalized (osteo)arthritis: Secondary | ICD-10-CM | POA: Diagnosis not present

## 2016-02-09 DIAGNOSIS — M6281 Muscle weakness (generalized): Secondary | ICD-10-CM | POA: Diagnosis not present

## 2016-03-10 DIAGNOSIS — R269 Unspecified abnormalities of gait and mobility: Secondary | ICD-10-CM | POA: Diagnosis not present

## 2016-03-10 DIAGNOSIS — M6281 Muscle weakness (generalized): Secondary | ICD-10-CM | POA: Diagnosis not present

## 2016-03-10 DIAGNOSIS — M150 Primary generalized (osteo)arthritis: Secondary | ICD-10-CM | POA: Diagnosis not present

## 2016-04-09 DIAGNOSIS — L97929 Non-pressure chronic ulcer of unspecified part of left lower leg with unspecified severity: Secondary | ICD-10-CM | POA: Diagnosis not present

## 2016-04-10 DIAGNOSIS — M150 Primary generalized (osteo)arthritis: Secondary | ICD-10-CM | POA: Diagnosis not present

## 2016-04-10 DIAGNOSIS — R269 Unspecified abnormalities of gait and mobility: Secondary | ICD-10-CM | POA: Diagnosis not present

## 2016-04-10 DIAGNOSIS — M6281 Muscle weakness (generalized): Secondary | ICD-10-CM | POA: Diagnosis not present

## 2016-04-13 DIAGNOSIS — I739 Peripheral vascular disease, unspecified: Secondary | ICD-10-CM | POA: Diagnosis not present

## 2016-04-13 DIAGNOSIS — R350 Frequency of micturition: Secondary | ICD-10-CM | POA: Diagnosis not present

## 2016-04-13 DIAGNOSIS — G629 Polyneuropathy, unspecified: Secondary | ICD-10-CM | POA: Diagnosis not present

## 2016-04-13 DIAGNOSIS — M2550 Pain in unspecified joint: Secondary | ICD-10-CM | POA: Diagnosis not present

## 2016-04-23 DIAGNOSIS — L89612 Pressure ulcer of right heel, stage 2: Secondary | ICD-10-CM | POA: Diagnosis not present

## 2016-04-23 DIAGNOSIS — R269 Unspecified abnormalities of gait and mobility: Secondary | ICD-10-CM | POA: Diagnosis not present

## 2016-04-23 DIAGNOSIS — I739 Peripheral vascular disease, unspecified: Secondary | ICD-10-CM | POA: Diagnosis not present

## 2016-04-23 DIAGNOSIS — R350 Frequency of micturition: Secondary | ICD-10-CM | POA: Diagnosis not present

## 2016-04-23 DIAGNOSIS — N390 Urinary tract infection, site not specified: Secondary | ICD-10-CM | POA: Diagnosis not present

## 2016-04-23 DIAGNOSIS — M2550 Pain in unspecified joint: Secondary | ICD-10-CM | POA: Diagnosis not present

## 2016-04-23 DIAGNOSIS — M150 Primary generalized (osteo)arthritis: Secondary | ICD-10-CM | POA: Diagnosis not present

## 2016-04-23 DIAGNOSIS — G629 Polyneuropathy, unspecified: Secondary | ICD-10-CM | POA: Diagnosis not present

## 2016-04-23 DIAGNOSIS — M6281 Muscle weakness (generalized): Secondary | ICD-10-CM | POA: Diagnosis not present

## 2016-04-27 DIAGNOSIS — E119 Type 2 diabetes mellitus without complications: Secondary | ICD-10-CM | POA: Diagnosis not present

## 2016-05-10 DIAGNOSIS — R269 Unspecified abnormalities of gait and mobility: Secondary | ICD-10-CM | POA: Diagnosis not present

## 2016-05-10 DIAGNOSIS — M150 Primary generalized (osteo)arthritis: Secondary | ICD-10-CM | POA: Diagnosis not present

## 2016-05-10 DIAGNOSIS — M6281 Muscle weakness (generalized): Secondary | ICD-10-CM | POA: Diagnosis not present

## 2016-06-08 DIAGNOSIS — I739 Peripheral vascular disease, unspecified: Secondary | ICD-10-CM | POA: Diagnosis not present

## 2016-06-08 DIAGNOSIS — G629 Polyneuropathy, unspecified: Secondary | ICD-10-CM | POA: Diagnosis not present

## 2016-06-08 DIAGNOSIS — E1151 Type 2 diabetes mellitus with diabetic peripheral angiopathy without gangrene: Secondary | ICD-10-CM | POA: Diagnosis not present

## 2016-06-08 DIAGNOSIS — N183 Chronic kidney disease, stage 3 (moderate): Secondary | ICD-10-CM | POA: Diagnosis not present

## 2016-06-08 DIAGNOSIS — E1122 Type 2 diabetes mellitus with diabetic chronic kidney disease: Secondary | ICD-10-CM | POA: Diagnosis not present

## 2016-06-08 DIAGNOSIS — E7800 Pure hypercholesterolemia, unspecified: Secondary | ICD-10-CM | POA: Diagnosis not present

## 2016-06-08 DIAGNOSIS — I10 Essential (primary) hypertension: Secondary | ICD-10-CM | POA: Diagnosis not present

## 2016-06-08 DIAGNOSIS — E0840 Diabetes mellitus due to underlying condition with diabetic neuropathy, unspecified: Secondary | ICD-10-CM | POA: Diagnosis not present

## 2016-07-06 DIAGNOSIS — R748 Abnormal levels of other serum enzymes: Secondary | ICD-10-CM | POA: Diagnosis not present

## 2016-07-06 DIAGNOSIS — N183 Chronic kidney disease, stage 3 (moderate): Secondary | ICD-10-CM | POA: Diagnosis not present

## 2016-08-18 DIAGNOSIS — R748 Abnormal levels of other serum enzymes: Secondary | ICD-10-CM | POA: Diagnosis not present

## 2016-09-30 DIAGNOSIS — E119 Type 2 diabetes mellitus without complications: Secondary | ICD-10-CM | POA: Diagnosis not present

## 2016-10-26 DIAGNOSIS — E1151 Type 2 diabetes mellitus with diabetic peripheral angiopathy without gangrene: Secondary | ICD-10-CM | POA: Diagnosis not present

## 2016-10-26 DIAGNOSIS — E0840 Diabetes mellitus due to underlying condition with diabetic neuropathy, unspecified: Secondary | ICD-10-CM | POA: Diagnosis not present

## 2016-10-26 DIAGNOSIS — N183 Chronic kidney disease, stage 3 (moderate): Secondary | ICD-10-CM | POA: Diagnosis not present

## 2016-10-26 DIAGNOSIS — I739 Peripheral vascular disease, unspecified: Secondary | ICD-10-CM | POA: Diagnosis not present

## 2017-02-02 DIAGNOSIS — Z8546 Personal history of malignant neoplasm of prostate: Secondary | ICD-10-CM | POA: Diagnosis not present

## 2017-02-02 DIAGNOSIS — R3915 Urgency of urination: Secondary | ICD-10-CM | POA: Diagnosis not present

## 2017-02-08 DIAGNOSIS — E119 Type 2 diabetes mellitus without complications: Secondary | ICD-10-CM | POA: Diagnosis not present

## 2017-03-08 DIAGNOSIS — E1151 Type 2 diabetes mellitus with diabetic peripheral angiopathy without gangrene: Secondary | ICD-10-CM | POA: Diagnosis not present

## 2017-03-08 DIAGNOSIS — E1122 Type 2 diabetes mellitus with diabetic chronic kidney disease: Secondary | ICD-10-CM | POA: Diagnosis not present

## 2017-03-08 DIAGNOSIS — N183 Chronic kidney disease, stage 3 (moderate): Secondary | ICD-10-CM | POA: Diagnosis not present

## 2017-03-08 DIAGNOSIS — E1321 Other specified diabetes mellitus with diabetic nephropathy: Secondary | ICD-10-CM | POA: Diagnosis not present

## 2017-04-27 DIAGNOSIS — E119 Type 2 diabetes mellitus without complications: Secondary | ICD-10-CM | POA: Diagnosis not present

## 2017-06-10 DIAGNOSIS — R197 Diarrhea, unspecified: Secondary | ICD-10-CM | POA: Diagnosis not present

## 2017-06-10 DIAGNOSIS — R739 Hyperglycemia, unspecified: Secondary | ICD-10-CM | POA: Diagnosis not present

## 2017-06-10 DIAGNOSIS — R51 Headache: Secondary | ICD-10-CM | POA: Diagnosis not present

## 2017-07-19 DIAGNOSIS — N183 Chronic kidney disease, stage 3 (moderate): Secondary | ICD-10-CM | POA: Diagnosis not present

## 2017-07-19 DIAGNOSIS — E0840 Diabetes mellitus due to underlying condition with diabetic neuropathy, unspecified: Secondary | ICD-10-CM | POA: Diagnosis not present

## 2017-07-19 DIAGNOSIS — E1151 Type 2 diabetes mellitus with diabetic peripheral angiopathy without gangrene: Secondary | ICD-10-CM | POA: Diagnosis not present

## 2017-07-19 DIAGNOSIS — E1122 Type 2 diabetes mellitus with diabetic chronic kidney disease: Secondary | ICD-10-CM | POA: Diagnosis not present

## 2017-09-27 DIAGNOSIS — E119 Type 2 diabetes mellitus without complications: Secondary | ICD-10-CM | POA: Diagnosis not present

## 2017-12-24 IMAGING — DX DG ABD PORTABLE 2V
2 series · 2 of 2 positions shown · non-contrast
Comparison: Lumbar spine and hip radiographs 01/31/2014.

CLINICAL DATA: Abdominal pain with weakness and diarrhea.

EXAM:
PORTABLE ABDOMEN - 2 VIEW

[abdomen kub]
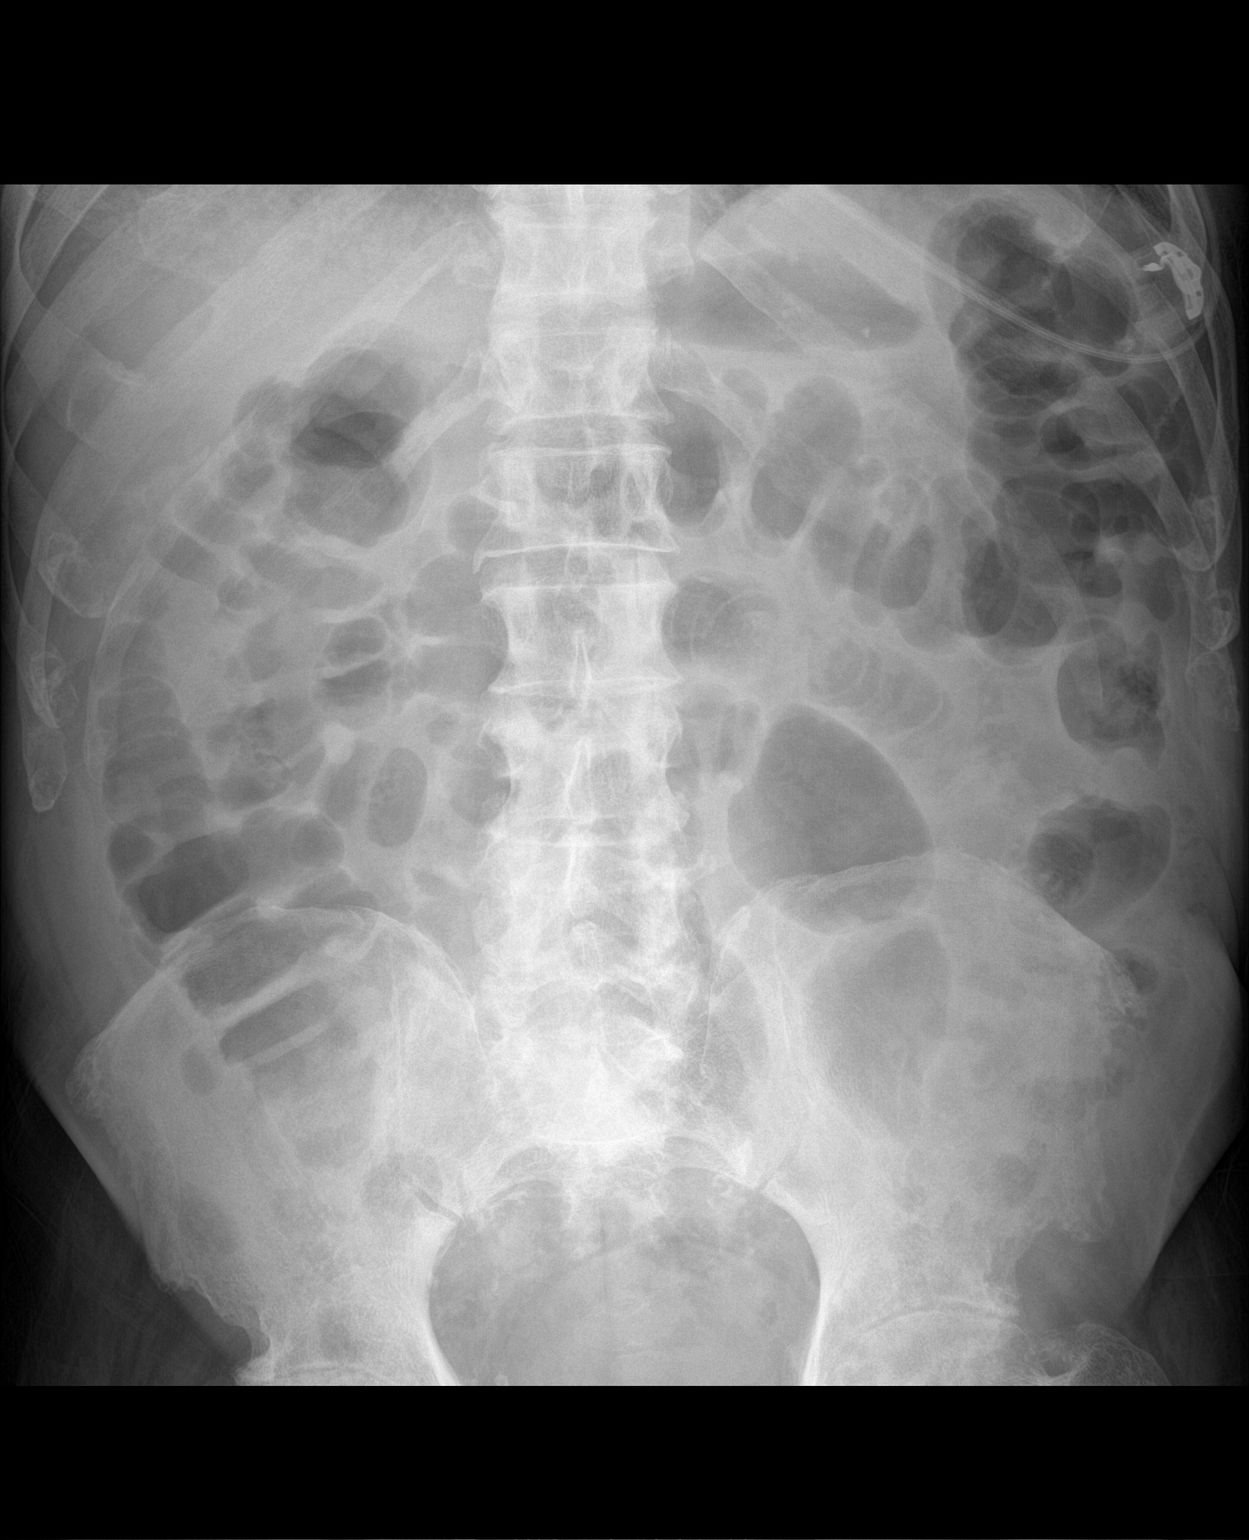

[abdomen decu]
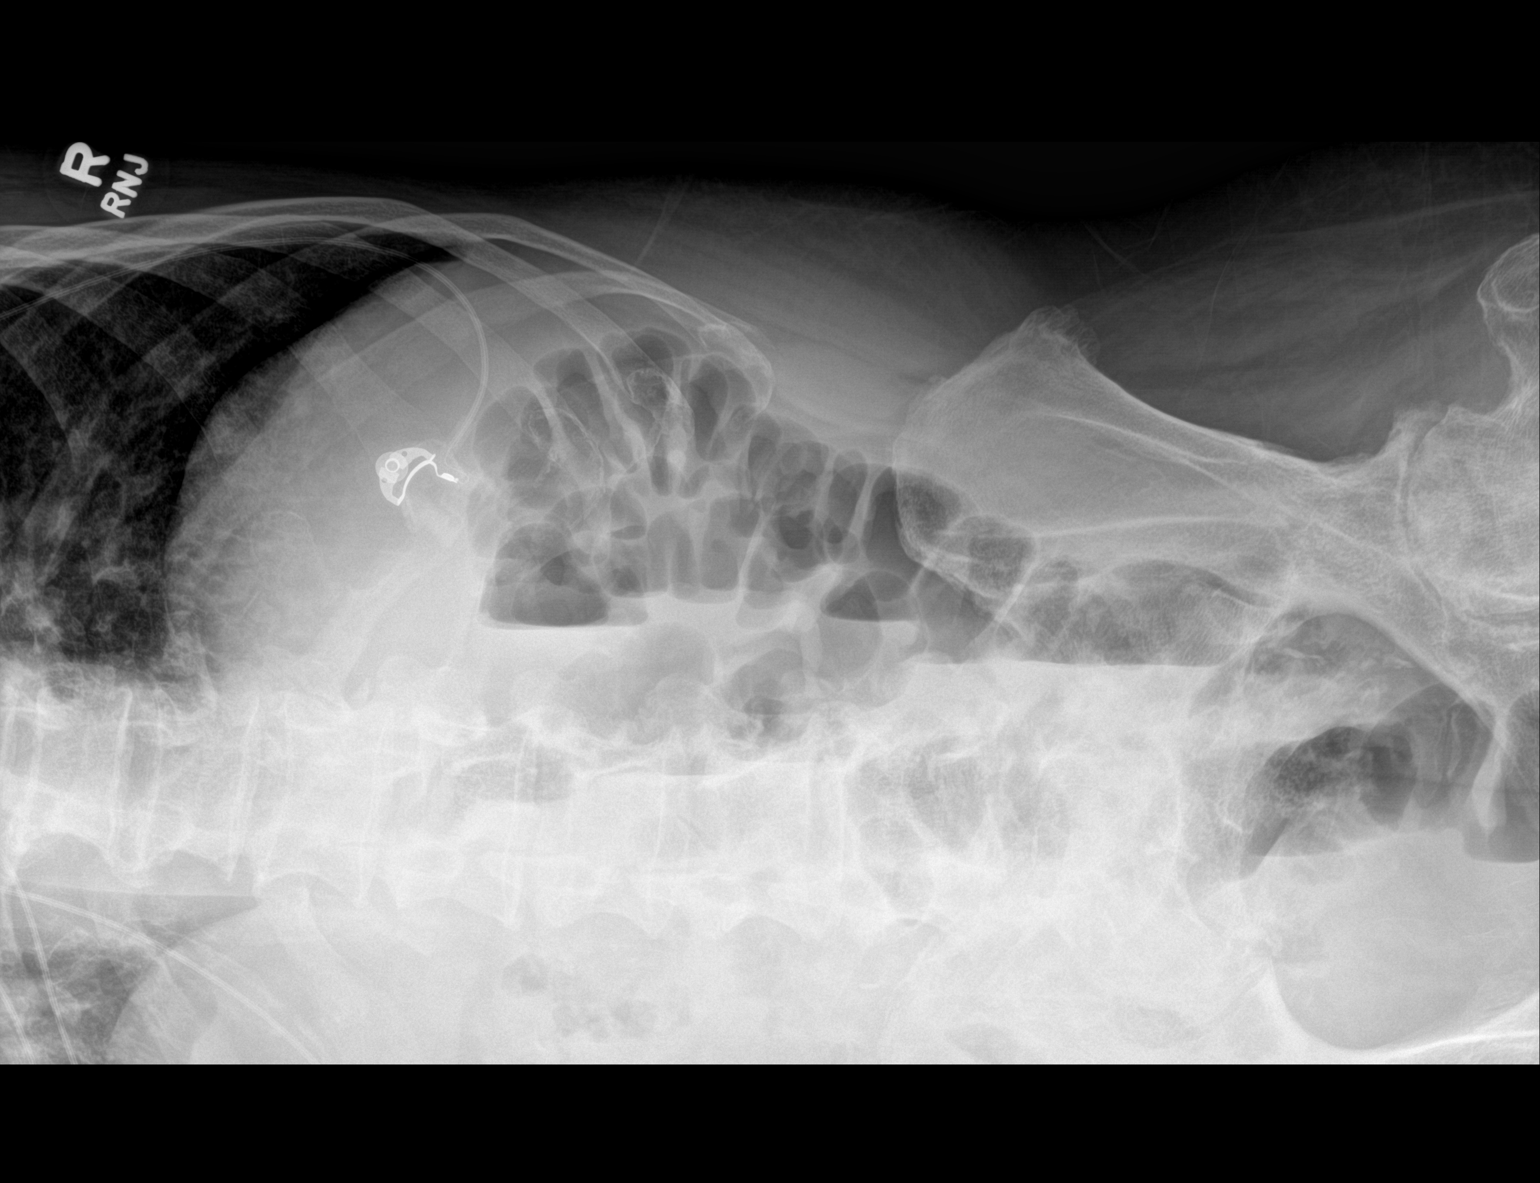

[2 of 2 positions shown; findings below may reference images not displayed]

FINDINGS: Gas is present throughout the small and large bowel. There are
scattered air-fluid levels within the colon on the decubitus view.
No significant bowel distention, wall thickening or free
intraperitoneal air demonstrated. Severe degenerative changes are
again noted at both hips. There are mild degenerative changes within
the lumbar spine. Prostate brachytherapy seeds are noted.
IMPRESSION: Nonobstructive bowel gas pattern. Scattered air-fluid levels within
the colon, nonspecific in the setting of diarrhea. Severe
osteoarthritis of both hips.

## 2018-01-26 DIAGNOSIS — R350 Frequency of micturition: Secondary | ICD-10-CM | POA: Diagnosis not present

## 2018-01-26 DIAGNOSIS — R269 Unspecified abnormalities of gait and mobility: Secondary | ICD-10-CM | POA: Diagnosis not present

## 2018-01-26 DIAGNOSIS — E7800 Pure hypercholesterolemia, unspecified: Secondary | ICD-10-CM | POA: Diagnosis not present

## 2018-01-26 DIAGNOSIS — G8929 Other chronic pain: Secondary | ICD-10-CM | POA: Diagnosis not present

## 2018-02-24 DIAGNOSIS — E119 Type 2 diabetes mellitus without complications: Secondary | ICD-10-CM | POA: Diagnosis not present

## 2018-02-27 DIAGNOSIS — E119 Type 2 diabetes mellitus without complications: Secondary | ICD-10-CM | POA: Diagnosis not present

## 2018-06-01 DIAGNOSIS — E119 Type 2 diabetes mellitus without complications: Secondary | ICD-10-CM | POA: Diagnosis not present

## 2018-06-02 ENCOUNTER — Other Ambulatory Visit: Payer: Self-pay | Admitting: Internal Medicine

## 2018-06-02 ENCOUNTER — Ambulatory Visit
Admission: RE | Admit: 2018-06-02 | Discharge: 2018-06-02 | Disposition: A | Discharge status: Home / Self Care | Payer: Medicare Other | Source: Ambulatory Visit | Attending: Internal Medicine | Admitting: Internal Medicine

## 2018-06-02 DIAGNOSIS — K5901 Slow transit constipation: Secondary | ICD-10-CM

## 2018-06-02 DIAGNOSIS — K5900 Constipation, unspecified: Secondary | ICD-10-CM | POA: Diagnosis not present

## 2018-06-07 DIAGNOSIS — N390 Urinary tract infection, site not specified: Secondary | ICD-10-CM | POA: Diagnosis not present

## 2018-06-21 DIAGNOSIS — R109 Unspecified abdominal pain: Secondary | ICD-10-CM | POA: Diagnosis not present

## 2018-06-21 DIAGNOSIS — N390 Urinary tract infection, site not specified: Secondary | ICD-10-CM | POA: Diagnosis not present

## 2018-06-29 ENCOUNTER — Other Ambulatory Visit: Payer: Self-pay | Admitting: Internal Medicine

## 2018-06-29 DIAGNOSIS — Z23 Encounter for immunization: Secondary | ICD-10-CM | POA: Diagnosis not present

## 2018-06-29 DIAGNOSIS — R1084 Generalized abdominal pain: Secondary | ICD-10-CM

## 2018-06-29 DIAGNOSIS — R634 Abnormal weight loss: Secondary | ICD-10-CM | POA: Diagnosis not present

## 2018-07-06 ENCOUNTER — Ambulatory Visit
Admission: RE | Admit: 2018-07-06 | Discharge: 2018-07-06 | Disposition: A | Discharge status: Home / Self Care | Payer: Medicare Other | Source: Ambulatory Visit | Attending: Internal Medicine | Admitting: Internal Medicine

## 2018-07-06 DIAGNOSIS — K8020 Calculus of gallbladder without cholecystitis without obstruction: Secondary | ICD-10-CM | POA: Diagnosis not present

## 2018-07-06 DIAGNOSIS — R1084 Generalized abdominal pain: Secondary | ICD-10-CM

## 2018-07-06 MED ORDER — IOHEXOL 300 MG/ML  SOLN
25.0000 mL | Freq: Once | INTRAMUSCULAR | Status: AC | PRN
  Administered 2018-07-06: 25 mL via ORAL

## 2018-07-06 MED ORDER — IOPAMIDOL (ISOVUE-300) INJECTION 61%
100.0000 mL | Freq: Once | INTRAVENOUS | Status: AC | PRN
  Administered 2018-07-06: 100 mL via INTRAVENOUS

## 2018-07-10 DIAGNOSIS — R935 Abnormal findings on diagnostic imaging of other abdominal regions, including retroperitoneum: Secondary | ICD-10-CM | POA: Diagnosis not present

## 2018-07-10 DIAGNOSIS — E0840 Diabetes mellitus due to underlying condition with diabetic neuropathy, unspecified: Secondary | ICD-10-CM | POA: Diagnosis not present

## 2018-07-10 DIAGNOSIS — R109 Unspecified abdominal pain: Secondary | ICD-10-CM | POA: Diagnosis not present

## 2018-07-27 DIAGNOSIS — E278 Other specified disorders of adrenal gland: Secondary | ICD-10-CM | POA: Diagnosis not present

## 2018-07-27 DIAGNOSIS — R634 Abnormal weight loss: Secondary | ICD-10-CM | POA: Diagnosis not present

## 2018-07-27 DIAGNOSIS — R109 Unspecified abdominal pain: Secondary | ICD-10-CM | POA: Diagnosis not present

## 2018-07-27 DIAGNOSIS — R935 Abnormal findings on diagnostic imaging of other abdominal regions, including retroperitoneum: Secondary | ICD-10-CM | POA: Diagnosis not present

## 2018-07-28 DIAGNOSIS — R935 Abnormal findings on diagnostic imaging of other abdominal regions, including retroperitoneum: Secondary | ICD-10-CM | POA: Diagnosis not present

## 2018-07-28 DIAGNOSIS — R109 Unspecified abdominal pain: Secondary | ICD-10-CM | POA: Diagnosis not present

## 2018-07-31 DIAGNOSIS — R935 Abnormal findings on diagnostic imaging of other abdominal regions, including retroperitoneum: Secondary | ICD-10-CM | POA: Diagnosis not present

## 2018-08-01 ENCOUNTER — Other Ambulatory Visit: Payer: Self-pay | Admitting: Gastroenterology

## 2018-08-01 DIAGNOSIS — I10 Essential (primary) hypertension: Secondary | ICD-10-CM | POA: Diagnosis not present

## 2018-08-01 DIAGNOSIS — R1013 Epigastric pain: Secondary | ICD-10-CM | POA: Diagnosis not present

## 2018-08-01 DIAGNOSIS — R933 Abnormal findings on diagnostic imaging of other parts of digestive tract: Secondary | ICD-10-CM | POA: Diagnosis not present

## 2018-08-01 DIAGNOSIS — R634 Abnormal weight loss: Secondary | ICD-10-CM | POA: Diagnosis not present

## 2018-08-01 NOTE — Anesthesia Preprocedure Evaluation (Addendum)
Anesthesia Evaluation  Patient identified by MRN, date of birth, ID band  Reviewed: Allergy & Precautions, NPO status , Patient's Chart, lab work & pertinent test results  Airway Mallampati: II  TM Distance: >3 FB Neck ROM: Full    Dental no notable dental hx. (+) Edentulous Upper, Poor Dentition,    Pulmonary neg pulmonary ROS,    Pulmonary exam normal breath sounds clear to auscultation       Cardiovascular hypertension, Pt. on medications negative cardio ROS Normal cardiovascular exam Rhythm:Regular Rate:Normal     Neuro/Psych negative neurological ROS     GI/Hepatic Neg liver ROS, GERD  Medicated,  Endo/Other  diabetes  Renal/GU negative Renal ROS     Musculoskeletal negative musculoskeletal ROS (+)   Abdominal   Peds  Hematology negative hematology ROS (+)   Anesthesia Other Findings   Reproductive/Obstetrics                            Anesthesia Physical Anesthesia Plan  ASA: III  Anesthesia Plan: MAC   Post-op Pain Management:    Induction: Intravenous  PONV Risk Score and Plan: 2 and Treatment may vary due to age or medical condition  Airway Management Planned: Nasal Cannula and Natural Airway  Additional Equipment:   Intra-op Plan:   Post-operative Plan:   Informed Consent: I have reviewed the patients History and Physical, chart, labs and discussed the procedure including the risks, benefits and alternatives for the proposed anesthesia with the patient or authorized representative who has indicated his/her understanding and acceptance.     Dental advisory given  Plan Discussed with:   Anesthesia Plan Comments: (Pt w abdominal pain and Nausea for EGD)       Anesthesia Quick Evaluation

## 2018-08-02 ENCOUNTER — Encounter (HOSPITAL_COMMUNITY): Payer: Self-pay

## 2018-08-02 ENCOUNTER — Encounter (HOSPITAL_COMMUNITY)
Admission: RE | Disposition: A | Discharge status: Home / Self Care | Payer: Self-pay | Source: Home / Self Care | Attending: Gastroenterology

## 2018-08-02 ENCOUNTER — Other Ambulatory Visit: Payer: Self-pay

## 2018-08-02 ENCOUNTER — Ambulatory Visit (HOSPITAL_COMMUNITY): Payer: Medicare Other | Admitting: Anesthesiology

## 2018-08-02 ENCOUNTER — Ambulatory Visit (HOSPITAL_COMMUNITY)
Admission: RE | Admit: 2018-08-02 | Discharge: 2018-08-02 | Disposition: A | Discharge status: Home / Self Care | Payer: Medicare Other | Attending: Gastroenterology | Admitting: Gastroenterology

## 2018-08-02 DIAGNOSIS — R6881 Early satiety: Secondary | ICD-10-CM | POA: Diagnosis not present

## 2018-08-02 DIAGNOSIS — K222 Esophageal obstruction: Secondary | ICD-10-CM | POA: Insufficient documentation

## 2018-08-02 DIAGNOSIS — E119 Type 2 diabetes mellitus without complications: Secondary | ICD-10-CM | POA: Insufficient documentation

## 2018-08-02 DIAGNOSIS — Z87891 Personal history of nicotine dependence: Secondary | ICD-10-CM | POA: Diagnosis not present

## 2018-08-02 DIAGNOSIS — I10 Essential (primary) hypertension: Secondary | ICD-10-CM | POA: Diagnosis not present

## 2018-08-02 DIAGNOSIS — Z888 Allergy status to other drugs, medicaments and biological substances status: Secondary | ICD-10-CM | POA: Insufficient documentation

## 2018-08-02 DIAGNOSIS — R634 Abnormal weight loss: Secondary | ICD-10-CM | POA: Insufficient documentation

## 2018-08-02 DIAGNOSIS — Z8546 Personal history of malignant neoplasm of prostate: Secondary | ICD-10-CM | POA: Insufficient documentation

## 2018-08-02 DIAGNOSIS — C168 Malignant neoplasm of overlapping sites of stomach: Secondary | ICD-10-CM | POA: Insufficient documentation

## 2018-08-02 DIAGNOSIS — Z7982 Long term (current) use of aspirin: Secondary | ICD-10-CM | POA: Diagnosis not present

## 2018-08-02 DIAGNOSIS — R933 Abnormal findings on diagnostic imaging of other parts of digestive tract: Secondary | ICD-10-CM | POA: Diagnosis not present

## 2018-08-02 DIAGNOSIS — K2970 Gastritis, unspecified, without bleeding: Secondary | ICD-10-CM | POA: Insufficient documentation

## 2018-08-02 DIAGNOSIS — Z79899 Other long term (current) drug therapy: Secondary | ICD-10-CM | POA: Insufficient documentation

## 2018-08-02 DIAGNOSIS — K3189 Other diseases of stomach and duodenum: Secondary | ICD-10-CM | POA: Diagnosis not present

## 2018-08-02 DIAGNOSIS — K259 Gastric ulcer, unspecified as acute or chronic, without hemorrhage or perforation: Secondary | ICD-10-CM | POA: Diagnosis not present

## 2018-08-02 DIAGNOSIS — C160 Malignant neoplasm of cardia: Secondary | ICD-10-CM | POA: Diagnosis not present

## 2018-08-02 DIAGNOSIS — K219 Gastro-esophageal reflux disease without esophagitis: Secondary | ICD-10-CM | POA: Diagnosis not present

## 2018-08-02 DIAGNOSIS — C162 Malignant neoplasm of body of stomach: Secondary | ICD-10-CM | POA: Diagnosis not present

## 2018-08-02 HISTORY — PX: BIOPSY: SHX5522

## 2018-08-02 HISTORY — PX: ESOPHAGOGASTRODUODENOSCOPY: SHX5428

## 2018-08-02 LAB — COMPREHENSIVE METABOLIC PANEL
ALT: 26 U/L (ref 0–44)
AST: 46 U/L — ABNORMAL HIGH (ref 15–41)
Albumin: 3.4 g/dL — ABNORMAL LOW (ref 3.5–5.0)
Alkaline Phosphatase: 47 U/L (ref 38–126)
Anion gap: 9 (ref 5–15)
BUN: 19 mg/dL (ref 8–23)
CALCIUM: 9.2 mg/dL (ref 8.9–10.3)
CO2: 26 mmol/L (ref 22–32)
CREATININE: 1.29 mg/dL — AB (ref 0.61–1.24)
Chloride: 103 mmol/L (ref 98–111)
GFR calc non Af Amer: 47 mL/min — ABNORMAL LOW (ref >60)
GFR, EST AFRICAN AMERICAN: 55 mL/min — AB (ref >60)
Glucose, Bld: 122 mg/dL — ABNORMAL HIGH (ref 70–99)
Potassium: 4.3 mmol/L (ref 3.5–5.1)
Sodium: 138 mmol/L (ref 135–145)
Total Bilirubin: 0.9 mg/dL (ref 0.3–1.2)
Total Protein: 6.5 g/dL (ref 6.5–8.1)

## 2018-08-02 LAB — GLUCOSE, CAPILLARY: Glucose-Capillary: 107 mg/dL — ABNORMAL HIGH (ref 70–99)

## 2018-08-02 LAB — CBC
HCT: 34.8 % — ABNORMAL LOW (ref 39.0–52.0)
Hemoglobin: 11.4 g/dL — ABNORMAL LOW (ref 13.0–17.0)
MCH: 29.2 pg (ref 26.0–34.0)
MCHC: 32.8 g/dL (ref 30.0–36.0)
MCV: 89 fL (ref 80.0–100.0)
Platelets: 133 10*3/uL — ABNORMAL LOW (ref 150–400)
RBC: 3.91 MIL/uL — ABNORMAL LOW (ref 4.22–5.81)
RDW: 15 % (ref 11.5–15.5)
WBC: 7.2 10*3/uL (ref 4.0–10.5)
nRBC: 0 % (ref 0.0–0.2)

## 2018-08-02 SURGERY — EGD (ESOPHAGOGASTRODUODENOSCOPY)
Anesthesia: Monitor Anesthesia Care

## 2018-08-02 MED ORDER — LACTATED RINGERS IV SOLN
INTRAVENOUS | Status: DC
  Administered 2018-08-02: 09:00:00 via INTRAVENOUS

## 2018-08-02 MED ORDER — PROPOFOL 500 MG/50ML IV EMUL
INTRAVENOUS | Status: DC | PRN
  Administered 2018-08-02: 75 ug/kg/min via INTRAVENOUS

## 2018-08-02 MED ORDER — SODIUM CHLORIDE 0.9 % IV SOLN: INTRAVENOUS | Status: DC

## 2018-08-02 MED ORDER — PROPOFOL 10 MG/ML IV BOLUS
INTRAVENOUS | Status: AC
  Filled 2018-08-02: qty 40

## 2018-08-02 NOTE — Brief Op Note (Signed)
08/02/2018  10:05 AM  PATIENT:  Mark Valenzuela  85 y.o. male  PRE-OPERATIVE DIAGNOSIS:  Abnormal CT scan  POST-OPERATIVE DIAGNOSIS:  gastric mass  PROCEDURE:  Procedure(s): ESOPHAGOGASTRODUODENOSCOPY (EGD) (N/A) BIOPSY  SURGEON:  Surgeon(s) and Role:    [name], MD - Primary  PHYSICIAN ASSISTANT:   ASSISTANTS: Cleda Daub, RN, Samuel Germany, Tech   ANESTHESIA:   MAC  EBL:  Minimal  BLOOD ADMINISTERED:none  DRAINS: none   LOCAL MEDICATIONS USED:  NONE  SPECIMEN:  Biopsy / Limited Resection  DISPOSITION OF SPECIMEN:  PATHOLOGY  COUNTS:  YES  TOURNIQUET:  * No tourniquets in log *  DICTATION: .Dragon Dictation  PLAN OF CARE: Discharge to home after PACU  PATIENT DISPOSITION:  PACU - hemodynamically stable.   Delay start of Pharmacological VTE agent (>24hrs) due to surgical blood loss or risk of bleeding: yes

## 2018-08-02 NOTE — Anesthesia Postprocedure Evaluation (Signed)
Anesthesia Post Note  Patient: Mark Valenzuela  Procedure(s) Performed: ESOPHAGOGASTRODUODENOSCOPY (EGD) (N/A ) BIOPSY     Patient location during evaluation: Endoscopy Anesthesia Type: MAC Level of consciousness: awake and alert Pain management: pain level controlled Vital Signs Assessment: post-procedure vital signs reviewed and stable Respiratory status: spontaneous breathing, nonlabored ventilation, respiratory function stable and patient connected to nasal cannula oxygen Cardiovascular status: blood pressure returned to baseline and stable Postop Assessment: no apparent nausea or vomiting Anesthetic complications: no    Last Vitals:  Vitals:   08/02/18 1020 08/02/18 1030  BP: (!) 193/102 (!) 175/81  Pulse: 84 81  Resp: (!) 26 19  Temp:    SpO2: 100% 100%    Last Pain:  Vitals:   08/02/18 1030  TempSrc:   PainSc: 0-No pain                 Barnet Glasgow

## 2018-08-02 NOTE — Op Note (Signed)
[facility] Patient Name: Mark Valenzuela Procedure Date: 08/02/2018 MRN: [id number] Attending MD: [name] , MD Date of Birth: [birth year] CSN: [id number] Age: 85 Admit Type: Outpatient Procedure:                Upper GI endoscopy Indications:              Abnormal CT of the GI tract, Early satiety, Weight                            loss Providers:                [name], MD, Cleda Daub, RN, William Dalton,                            Technician Referring MD:              Medicines:                Monitored Anesthesia Care Complications:            No immediate complications. Estimated blood loss:                            Minimal. Estimated Blood Loss:     Estimated blood loss was minimal. Procedure:                Pre-Anesthesia Assessment:                           - Prior to the procedure, a History and Physical                            was performed, and patient medications and                            allergies were reviewed. The patient's tolerance of                            previous anesthesia was also reviewed. The risks                            and benefits of the procedure and the sedation                            options and risks were discussed with the patient.                            All questions were answered, and informed consent                            was obtained. Prior Anticoagulants: The patient has                            taken aspirin, last dose was 1 day prior to                            procedure.  ASA Grade Assessment: III - A patient                            with severe systemic disease. After reviewing the                            risks and benefits, the patient was deemed in                            satisfactory condition to undergo the procedure.                           After obtaining informed consent, the endoscope was                            passed under direct vision. Throughout the                procedure, the patient's blood pressure, pulse, and                            oxygen saturations were monitored continuously. The                            GIF-H190 ([id number]) Olympus gastroscope was                            introduced through the mouth, and advanced to the                            second part of duodenum. The upper GI endoscopy was                            accomplished without difficulty. The patient                            tolerated the procedure well. Scope In: Scope Out: Findings:      One malignant-appearing, intrinsic moderate (circumferential scarring or       stenosis; an endoscope may pass) stenosis was found 37 to 38 cm from the       incisors. The stenosis was traversed. Biopsies were taken with a cold       forceps for histology.      Diffuse severe mucosal changes characterized by erythema, inflammation,       nodularity, altered texture and ulceration were found in the cardia, in       the gastric fundus, in the gastric body, on the greater curvature of the       stomach, on the lesser curvature of the stomach and at the incisura.       Biopsies were taken with a cold forceps for histology.      The lumen of the entire stomach was narrowed. Changes of malignancy       involving most of the proximal stomach noted. The antrum appeared       unremarkable.      Retroflexion could not be performed due to narrowed lumen and extreme  friability of gastric mucosa.      The examined duodenum was normal. Impression:               - Malignant-appearing esophageal stenosis. Biopsied.                           - Erythematous, inflamed, nodular, texture changed                            and ulcerated mucosa in the cardia, gastric fundus,                            gastric body, greater curvature, lesser curvature                            and incisura. Biopsied. Most of proximal stomach                            was involved with  malignancy.                           - Normal examined duodenum. Moderate Sedation:      Patient did not receive moderate sedation for this procedure, but       instead received monitored anesthesia care. Recommendation:           - Await pathology results.                           - No aspirin, ibuprofen, naproxen, or other                            non-steroidal anti-inflammatory drugs.                           - Refer to an oncologist at appointment to be                            scheduled. Procedure Code(s):        --- Professional ---                           [phone], Esophagogastroduodenoscopy, flexible,                            transoral; with biopsy, single or multiple Diagnosis Code(s):        --- Professional ---                           K22.2, Esophageal obstruction                           K29.70, Gastritis, unspecified, without bleeding                           K31.89, Other diseases of stomach and duodenum  K25.9, Gastric ulcer, unspecified as acute or                            chronic, without hemorrhage or perforation                           R68.81, Early satiety                           R63.4, Abnormal weight loss                           R93.3, Abnormal findings on diagnostic imaging of                            other parts of digestive tract CPT copyright 2018 [employer]. All rights reserved. The codes documented in this report are preliminary and upon coder review may  be revised to meet current compliance requirements. [name], MD 08/02/2018 10:05:14 AM This report has been signed electronically. Number of Addenda: 0

## 2018-08-02 NOTE — Discharge Instructions (Signed)
Upper Endoscopy, Adult Upper endoscopy is a procedure to look inside the upper GI (gastrointestinal) tract. The upper GI tract is made up of:  The part of the body that moves food from your mouth to your stomach (esophagus).  The stomach.  The first part of your small intestine (duodenum). This procedure is also called esophagogastroduodenoscopy (EGD) or gastroscopy. In this procedure, your health care provider passes a thin, flexible tube (endoscope) through your mouth and down your esophagus into your stomach. A small camera is attached to the end of the tube. Images from the camera appear on a monitor in the exam room. During this procedure, your health care provider may also remove a small piece of tissue to be sent to a lab and examined under a microscope (biopsy). Your health care provider may do an upper endoscopy to diagnose cancers of the upper GI tract. You may also have this procedure to find the cause of other conditions, such as:  Stomach pain.  Heartburn.  Pain or problems when swallowing.  Nausea and vomiting.  Stomach bleeding.  Stomach ulcers. Tell a health care provider about:  Any allergies you have.  All medicines you are taking, including vitamins, herbs, eye drops, creams, and over-the-counter medicines.  Any problems you or family members have had with anesthetic medicines.  Any blood disorders you have.  Any surgeries you have had.  Any medical conditions you have.  Whether you are pregnant or may be pregnant. What are the risks? Generally, this is a safe procedure. However, problems may occur, including:  Infection.  Bleeding.  Allergic reactions to medicines.  A tear or hole (perforation) in the esophagus, stomach, or duodenum. What happens before the procedure? Staying hydrated Follow instructions from your health care provider about hydration, which may include:  Up to 2 hours before the procedure - you may continue to drink clear  liquids, such as water, clear fruit juice, black coffee, and plain tea.  Eating and drinking restrictions Follow instructions from your health care provider about eating and drinking, which may include:  8 hours before the procedure - stop eating heavy meals or foods, such as meat, fried foods, or fatty foods.  6 hours before the procedure - stop eating light meals or foods, such as toast or cereal.  6 hours before the procedure - stop drinking milk or drinks that contain milk.  2 hours before the procedure - stop drinking clear liquids. Medicines Ask your health care provider about:  Changing or stopping your regular medicines. This is especially important if you are taking diabetes medicines or blood thinners.  Taking medicines such as aspirin and ibuprofen. These medicines can thin your blood. Do not take these medicines unless your health care provider tells you to take them.  Taking over-the-counter medicines, vitamins, herbs, and supplements. General instructions  Plan to have someone take you home from the hospital or clinic.  If you will be going home right after the procedure, plan to have someone with you for 24 hours.  Ask your health care provider what steps will be taken to help prevent infection. What happens during the procedure?   An IV will be inserted into one of your veins.  You may be given one or more of the following: ? A medicine to help you relax (sedative). ? A medicine to numb the throat (local anesthetic).  You will lie on your left side on an exam table.  Your health care provider will pass the endoscope through  your mouth and down your esophagus. °· Your health care provider will use the scope to check the inside of your esophagus, stomach, and duodenum. Biopsies may be taken. °· The endoscope will be removed. °The procedure may vary among health care providers and hospitals. °What happens after the procedure? °· Your blood pressure, heart rate,  breathing rate, and blood oxygen level will be monitored until you leave the hospital or clinic. °· Do not drive for 24 hours if you were given a sedative during your procedure. °· When your throat is no longer numb, you may be given some fluids to drink. °· It is up to you to get the results of your procedure. Ask your health care provider, or the department that is doing the procedure, when your results will be ready. °Summary °· Upper endoscopy is a procedure to look inside the upper GI tract. °· During the procedure, an IV will be inserted into one of your veins. You may be given a medicine to help you relax. °· A medicine will be used to numb your throat. °· The endoscope will be passed through your mouth and down your esophagus. °This information is not intended to replace advice given to you by your health care provider. Make sure you discuss any questions you have with your health care provider. °Document Released: 04/30/2000 Document Revised: 10/03/2017 Document Reviewed: 10/03/2017 °Elsevier Interactive Patient Education © 2019 [employer] ° °

## 2018-08-02 NOTE — Transfer of Care (Signed)
Immediate Anesthesia Transfer of Care Note  Patient: Mark Valenzuela  Procedure(s) Performed: ESOPHAGOGASTRODUODENOSCOPY (EGD) (N/A ) BIOPSY  Patient Location: PACU and Endoscopy Unit  Anesthesia Type:MAC  Level of Consciousness: awake, alert , oriented and patient cooperative  Airway & Oxygen Therapy: Patient Spontanous Breathing and Patient connected to face mask oxygen  Post-op Assessment: Report given to RN, Post -op Vital signs reviewed and stable and Patient moving all extremities  Post vital signs: Reviewed and stable  Last Vitals:  Vitals Value Taken Time  BP    Temp    Pulse 84 08/02/2018 10:07 AM  Resp 25 08/02/2018 10:07 AM  SpO2 100 % 08/02/2018 10:07 AM  Vitals shown include unvalidated device data.  Last Pain:  Vitals:   08/02/18 0918  TempSrc: Oral  PainSc: 0-No pain         Complications: No apparent anesthesia complications

## 2018-08-02 NOTE — H&P (Signed)
85 year old patient was seen by Dr. Oletta Lamas in the office yesterday for nausea, weight loss and abdominal pain.  He had a CAT scan 3 weeks ago which showed gallstone, multiple hepatic cysts and marked thickening of proximal stomach concerning for tumor.  There were lucencies throughout pelvis and sacrum.  Along with an adrenal mass.  He had an EGD and a colonoscopy by Dr. 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  He has lost about 15 pounds in the last 1 month, complains of early satiety, difficulty swallowing solids as well as liquids.  He has been on pantoprazole with minimal relief.  He has history of prostate cancer with seed implants, type 2 diabetes, history of C. difficile infection and hypertension.  Medical history hypertension, prostate cancer, seed implant, cataract, osteoarthritis of left hip, diabetes, TB treated in 2002, C. difficile in 07/2015, UTI 07/2015, H. pylori infection in the past  Surgical history prostate implants, cyst removed from back  Non-smoker, former smoker, quit 20 years ago, no recreational drug use.  Medications pantoprazole, aspirin 81 mg, losartan, Myrbetriq,, ropinirole, atorvastatin, clonidine, gabapentin, Flomax, Norco.  Allergy to lisinopril Weight 154.9 pounds Temperature 98.9 Heart rate 100 Blood pressure 130/76.  Thinly built African-American gentleman Missing dentures Bowel sounds audible, soft nondistended abdomen Normal breath sounds bilaterally Regular rate and rhythm Oriented x3 Able to move all extremities   Assessment and plan Abnormal CAT scan, epigastric pain, weight loss Diagnostic endoscopy with biopsies Possible balloon dilatation if needed for esophageal dysphagia The risk and benefits of the procedure were discussed with the patient and his daughter at bedside.  They verbalized understanding and consent.  [name], MD

## 2018-08-04 ENCOUNTER — Encounter (HOSPITAL_COMMUNITY): Payer: Self-pay | Admitting: Gastroenterology

## 2018-09-15 DEATH — deceased

## 2020-12-08 IMAGING — CT CT ABD-PELV W/ CM
2 of 5 series · 12 of 46 positions shown, 14 images · IV contrast (omnipaque)
Comparison: None available currently.

CLINICAL DATA: Nausea, loss of appetite.

EXAM:
CT ABDOMEN AND PELVIS WITH CONTRAST
TECHNIQUE: Multidetector CT imaging of the abdomen and pelvis was performed
using the standard protocol following bolus administration of
intravenous contrast.
CONTRAST:  25mL OMNIPAQUE IOHEXOL 300 MG/ML SOLN orally, 100mL
WGFPT2-V99 IOPAMIDOL (WGFPT2-V99) INJECTION 61% intravenously.

[Series 2: abd pelvis 5.00 br40 s3 ax · axial · 0.65mm/px · z∈[+1089,+1439]mm · 9 of 88 slices shown, 11 images]
[im 9/88  soft-tissue]
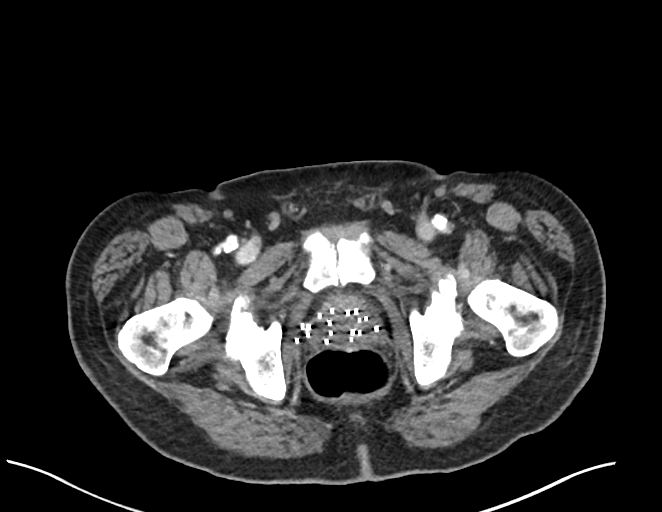
[im 9/88  bone]
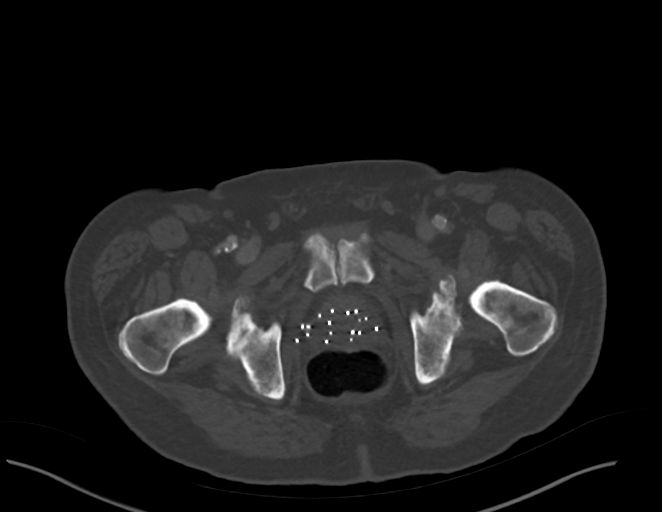
[im 17/88  soft-tissue]
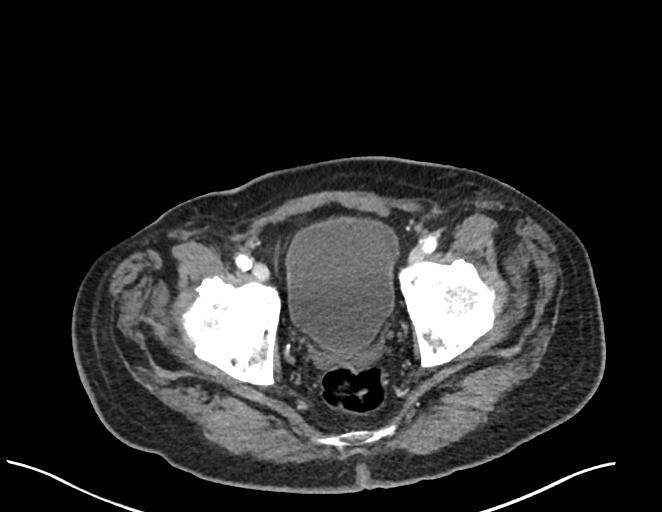
[im 25/88  soft-tissue]
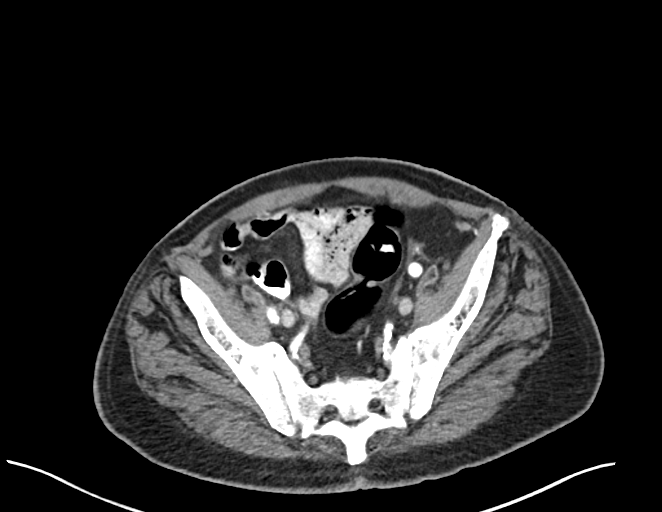
[im 34/88  soft-tissue]
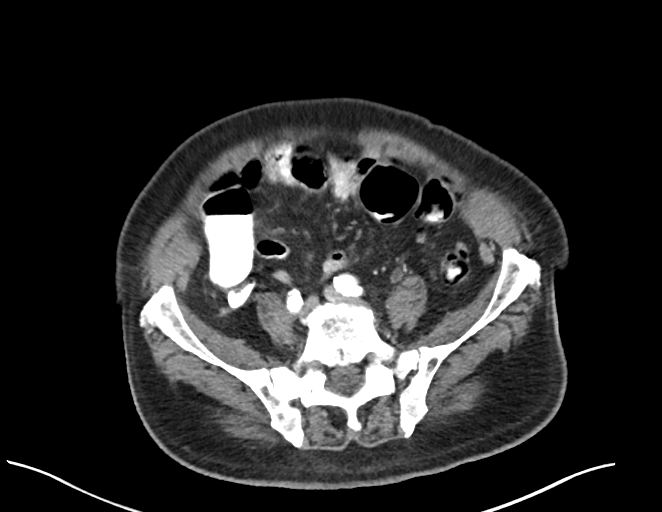
[im 46/88  soft-tissue]
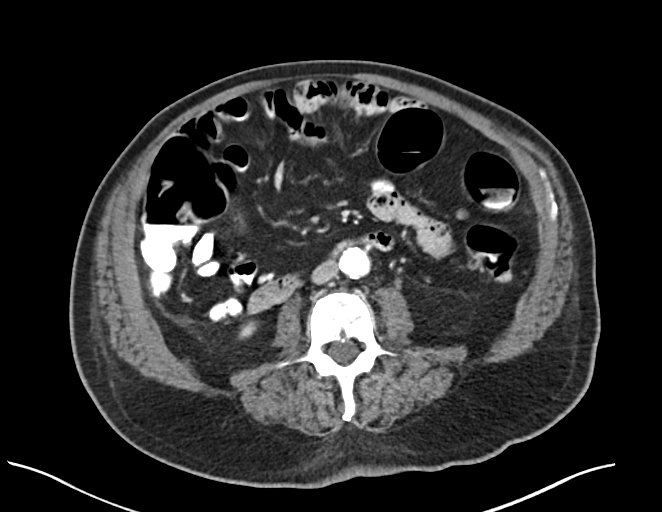
[im 54/88  soft-tissue]
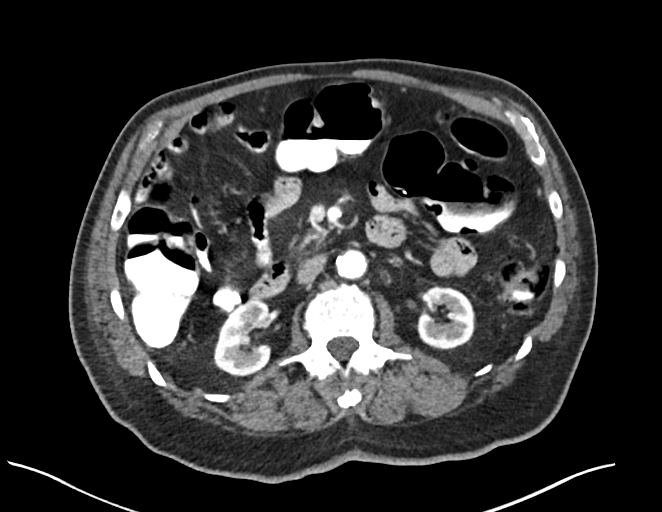
[im 63/88  soft-tissue]
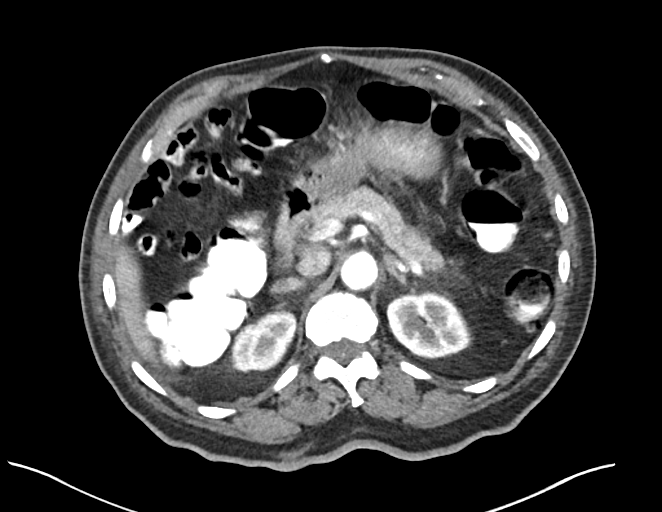
[im 71/88  soft-tissue]
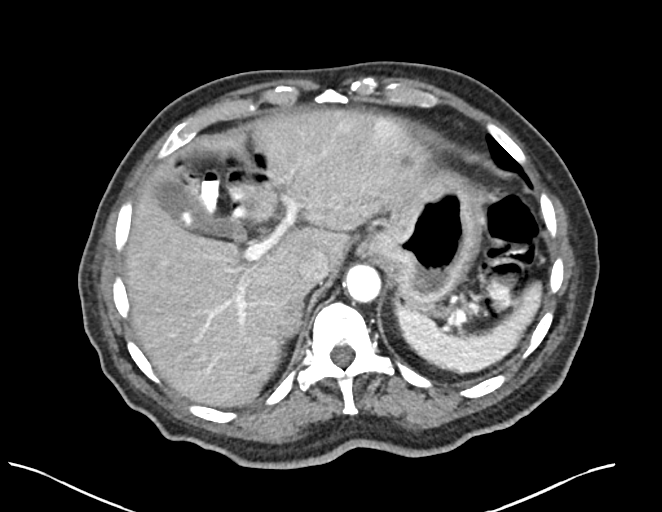
[im 79/88  soft-tissue]
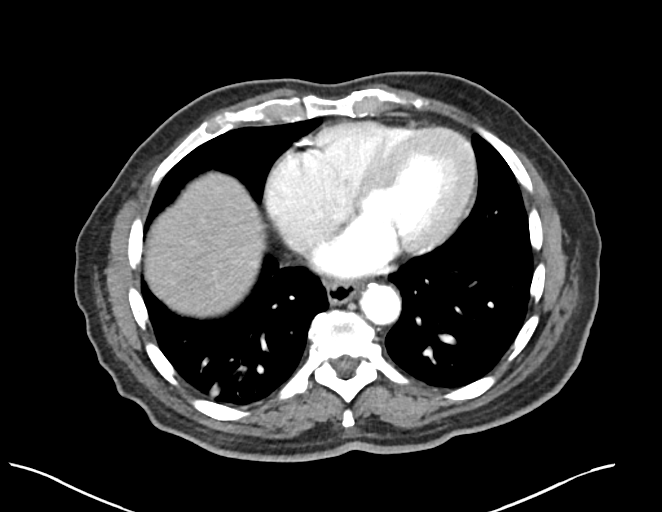
[im 79/88  bone]
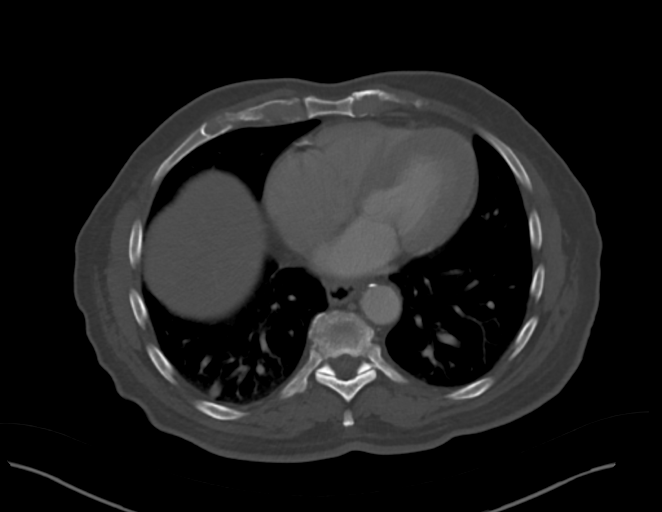

[Series 6: abd pelvis 2.00 br40 s3 cor · coronal · 0.70mm/px · 3 of 142 slices shown]
[im 48/142  soft-tissue]
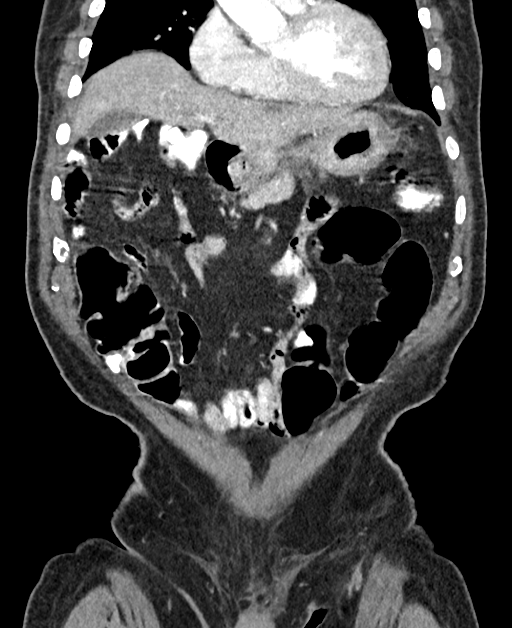
[im 63/142  soft-tissue]
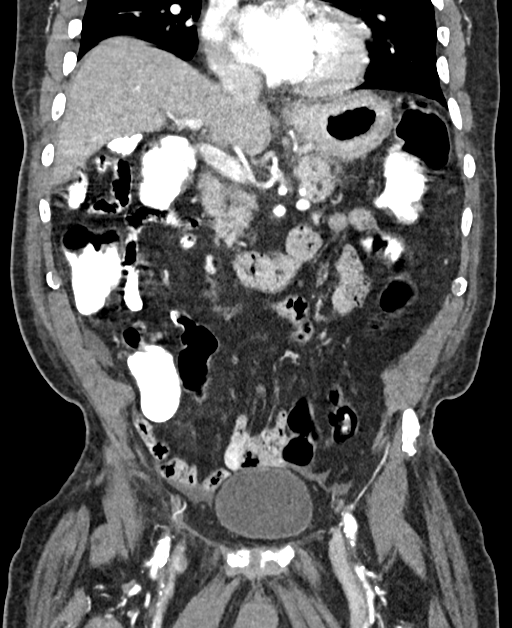
[im 79/142  soft-tissue]
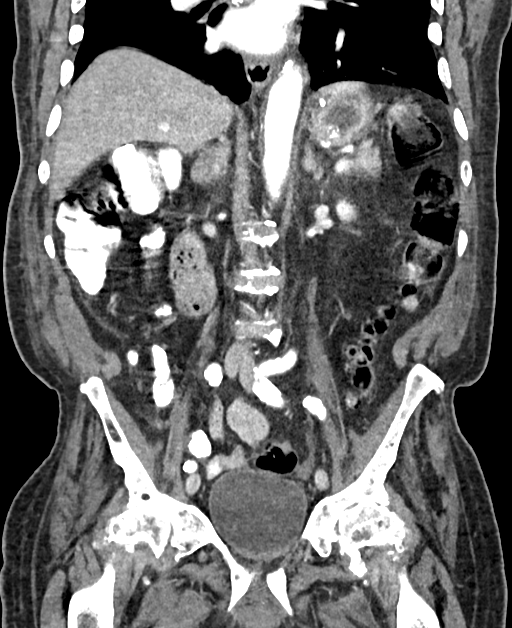

[12 of 46 positions shown; findings below may reference images not displayed]

FINDINGS: Lower chest: No acute abnormality.

Hepatobiliary: Solitary gallstone is noted without inflammation. No
biliary dilatation is noted. Multiple hepatic cysts are noted.

Pancreas: Unremarkable. No pancreatic ductal dilatation or
surrounding inflammatory changes.

Spleen: Normal in size without focal abnormality.

Adrenals/Urinary Tract: 2.6 cm right adrenal mass is noted. Left
adrenal gland is unremarkable. No hydronephrosis or renal
obstruction is noted. No renal or ureteral calculi are noted.
Urinary bladder is unremarkable.

Stomach/Bowel: Moderate wall thickening of the proximal stomach is
noted concerning for possible inflammation or lymphoma. There is no
evidence of large or small bowel dilatation or inflammation. The
appendix appears normal.

Vascular/Lymphatic: Aortic atherosclerosis. No enlarged abdominal or
pelvic lymph nodes.

Reproductive: Status post prostatic brachytherapy seed placement.

Other: No abdominal wall hernia or abnormality. No abdominopelvic
ascites.

Musculoskeletal: Multiple rounded lucencies are noted within the
pelvis and sacrum; clinical correlation is recommended to rule out
multiple myeloma. Degenerative changes are seen involving both hip
joints.
IMPRESSION: Moderate wall thickening of proximal stomach is noted concerning for
possible inflammation or infiltrative disease such as lymphoma.
Endoscopy is recommended for further evaluation. These results will
be called to the ordering clinician or representative by the
Radiologist Assistant, and communication documented in the PACS or
[employer] Dashboard.

Multiple rounded lucencies are noted within the pelvis and sacrum.
These may represent benign abnormality, but clinical correlation is
recommended to rule out multiple myeloma.

2.6 cm right adrenal mass is noted; malignancy or metastatic disease
can not be excluded. Further evaluation with MRI is recommended.

Solitary gallstone without inflammation.

Status post prostatic brachytherapy seed placement.

Aortic Atherosclerosis (291P8-KGH.H).
